# Patient Record
Sex: Female | Born: 1975 | Race: Black or African American | Hispanic: No | Marital: Married | Smoking: Never smoker
Health system: Southern US, Community
[De-identification: ages and names within clinical notes are randomized; demographics above are authoritative.]

## PROBLEM LIST (undated history)

## (undated) HISTORY — PX: ECTOPIC PREGNANCY SURGERY: SHX613

---

## 2006-07-18 ENCOUNTER — Ambulatory Visit (HOSPITAL_COMMUNITY): Admission: RE | Admit: 2006-07-18 | Discharge: 2006-07-18 | Payer: Self-pay | Admitting: Obstetrics and Gynecology

## 2008-11-23 ENCOUNTER — Inpatient Hospital Stay (HOSPITAL_COMMUNITY): Admission: RE | Admit: 2008-11-23 | Discharge: 2008-11-25 | Payer: Self-pay | Admitting: Obstetrics and Gynecology

## 2008-11-24 ENCOUNTER — Encounter: Payer: Self-pay | Admitting: Obstetrics and Gynecology

## 2008-12-20 ENCOUNTER — Inpatient Hospital Stay (HOSPITAL_COMMUNITY): Admission: AD | Admit: 2008-12-20 | Discharge: 2008-12-22 | Payer: Self-pay | Admitting: Obstetrics and Gynecology

## 2010-08-19 ENCOUNTER — Encounter: Payer: Self-pay | Admitting: Obstetrics and Gynecology

## 2010-08-20 ENCOUNTER — Encounter: Payer: Self-pay | Admitting: Obstetrics and Gynecology

## 2010-10-19 IMAGING — US US OB COMP LESS 14 WK
1 series · 7 of 7 positions shown · non-contrast
Comparison: None.

CLINICAL DATA: bleeding; ; TRIPLETS

OBSTETRIC <14 WK US AND TRANSVAGINAL OB US
TECHNIQUE: Both transabdominal and transvaginal ultrasound
examinations were performed for complete evaluation of the
gestation as well as the maternal uterus, adnexal regions, and
pelvic cul-de-sac.

[Series 1: us ob comp less 14 wk · 7 of 7 slices shown]
[im 1/7]
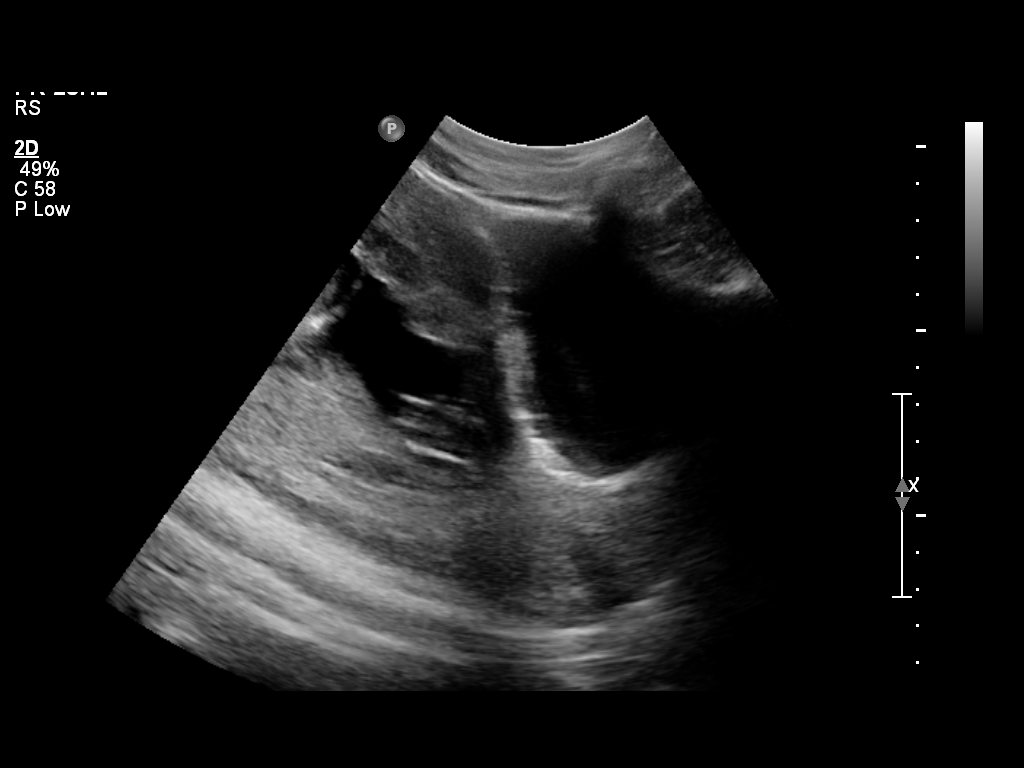
[im 2/7]
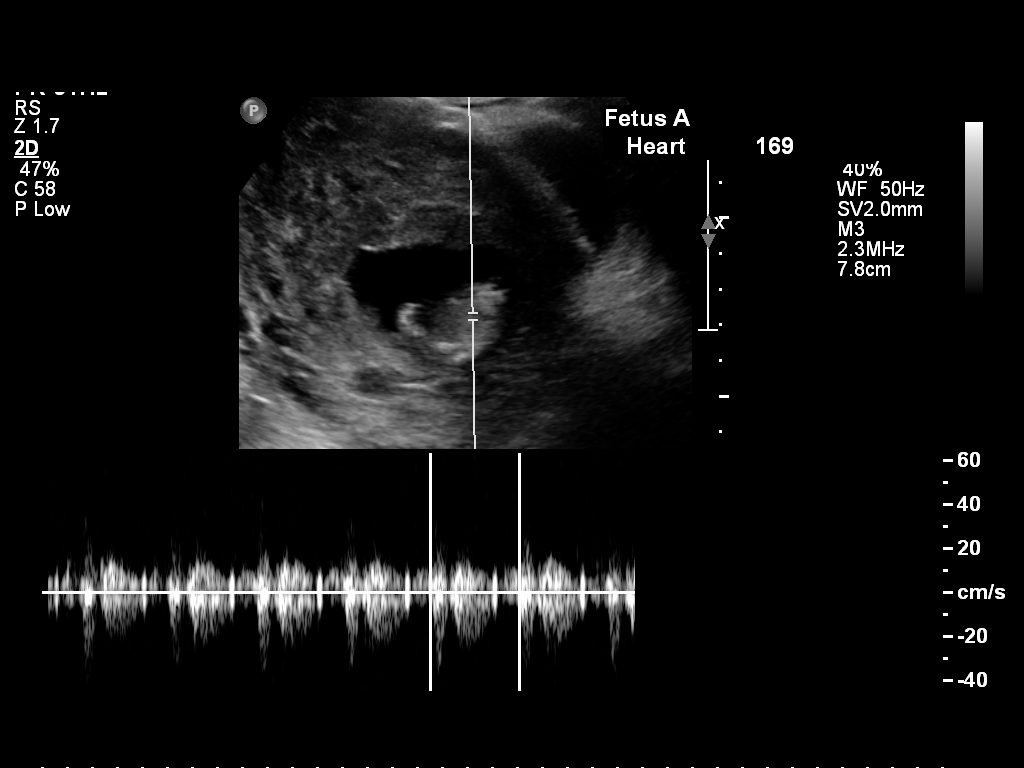
[im 3/7]
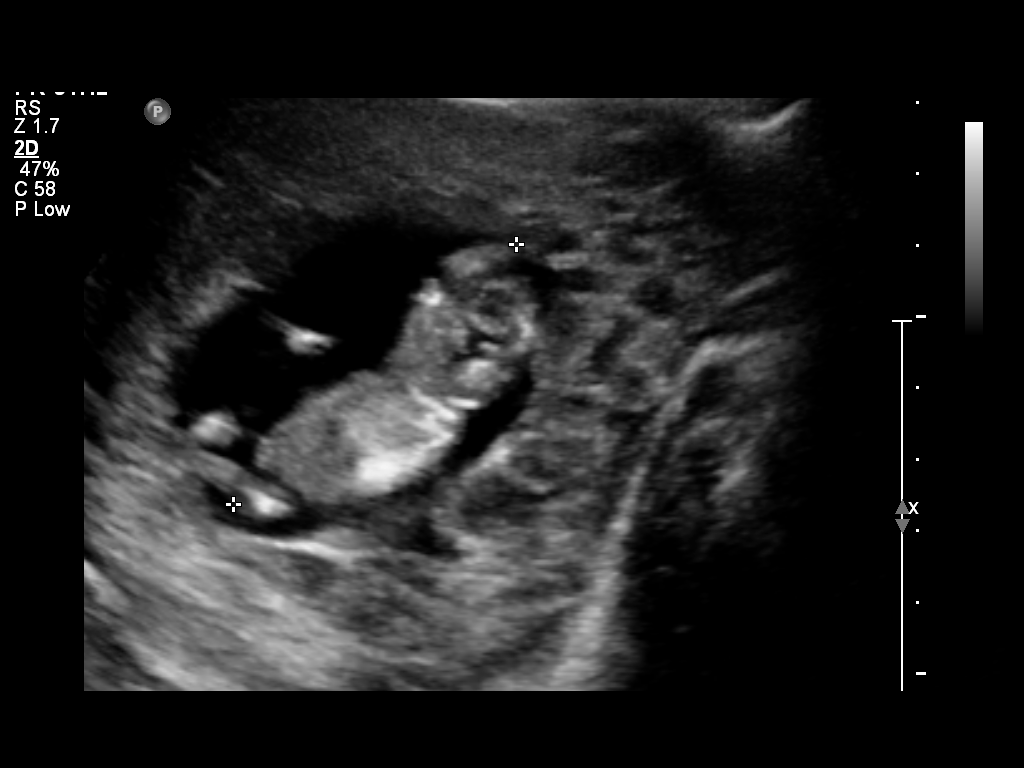
[im 4/7]
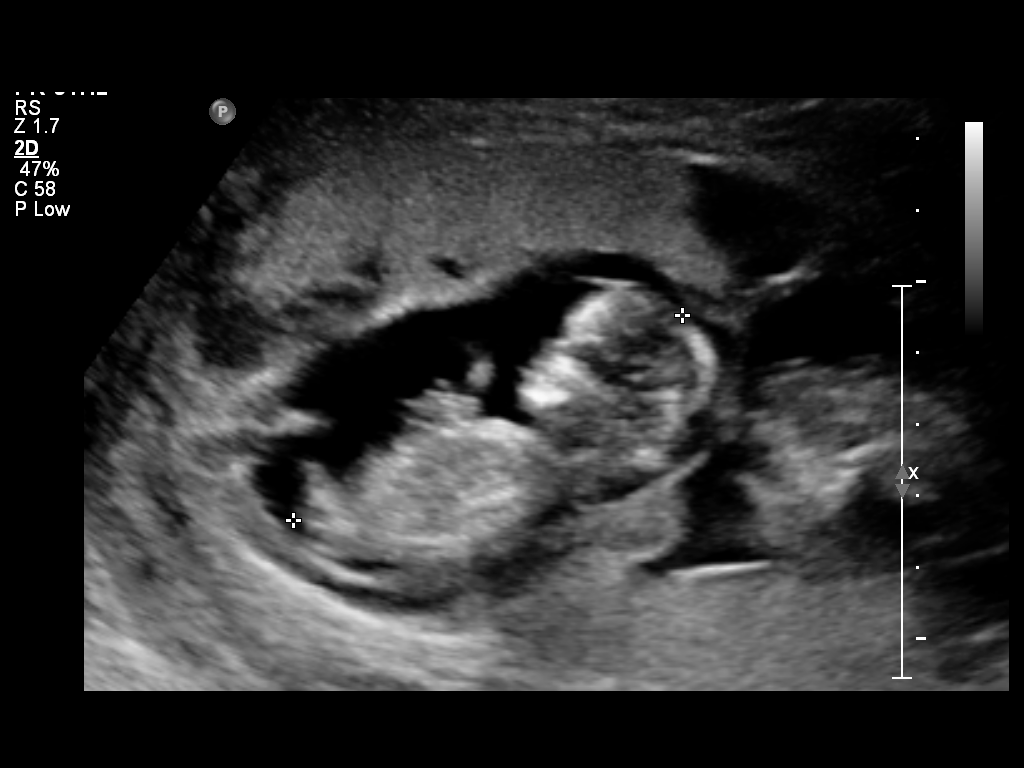
[im 5/7]
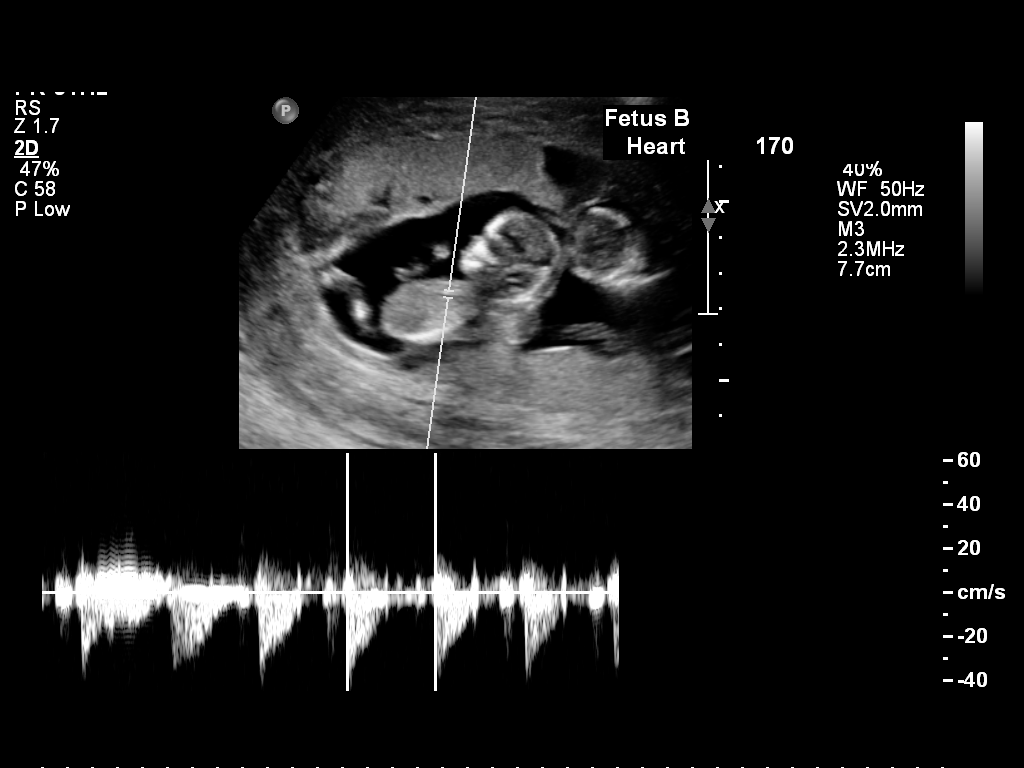
[im 6/7]
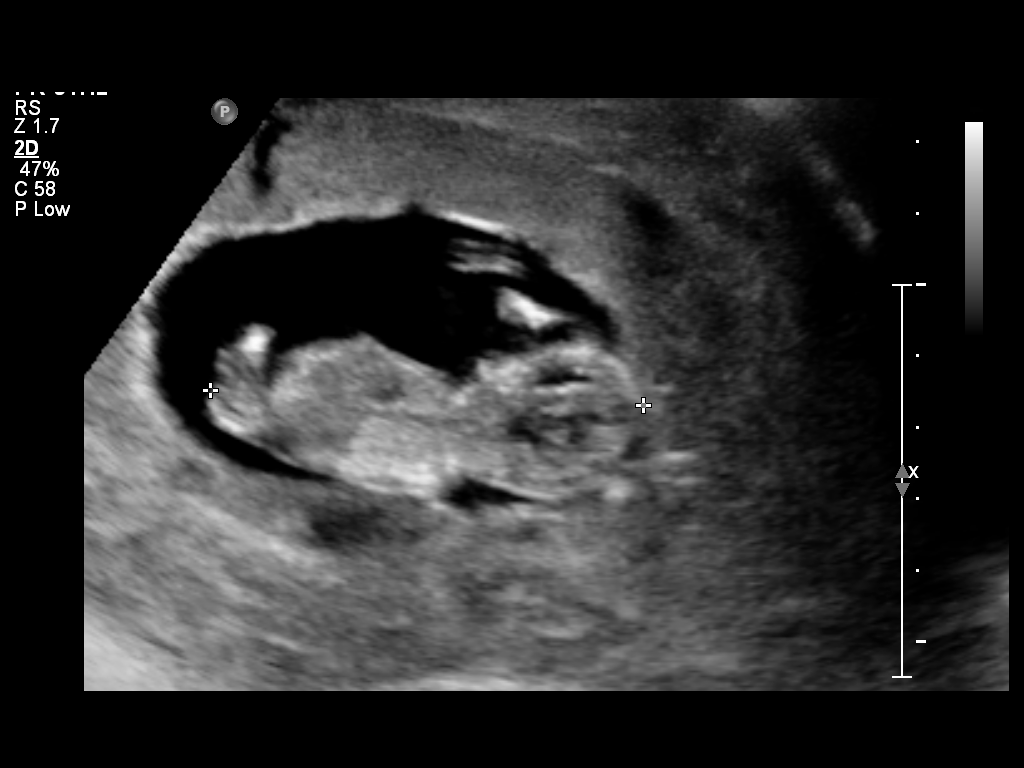
[im 7/7]
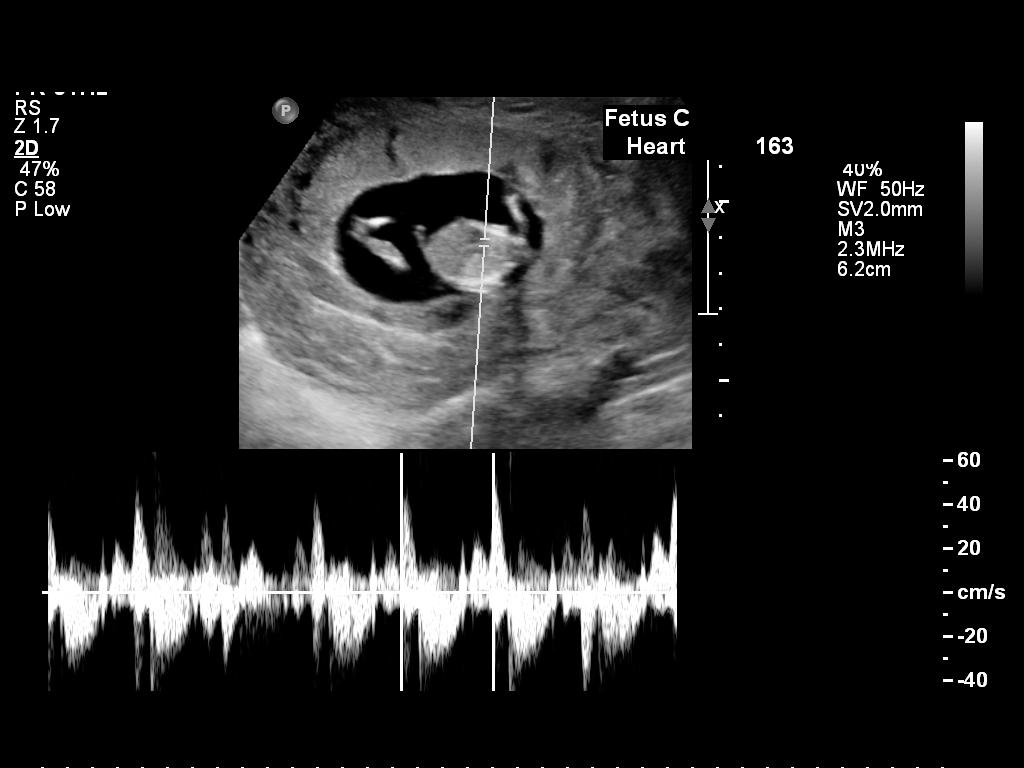

[7 of 7 positions shown; findings below may reference images not displayed]

FINDINGS: There are three intrauterine gestations identified.
Fetus A has a crown-rump length of 53.5 mm per estimated
gestational age 12 weeks 1 day.  Fetal heart rate 169 beats per
minute.  Fetus B has a crown-rump length of 61.2 mm for estimated
gestational age 12 weeks 4 days.  Fetal heart rate 170 beats per
minute.  Fetus C has a crown-rump length of 60.3 mm for estimated
gestational age 12 weeks 4 days.  Fetal heart rate 163 beats per
minute.  No subchorionic hemorrhage.

Ovaries not visualized.  No free fluid visualized.
IMPRESSION: Viable triplets as described above.

## 2010-10-19 IMAGING — CT CT HEAD W/O CM
1 series · 16 of 30 positions shown, 20 images · non-contrast
Comparison: None.

CLINICAL DATA: Left-sided headaches and numbness.

CT HEAD WITHOUT CONTRAST
TECHNIQUE: Contiguous axial images were obtained from the base of
the skull through the vertex without contrast.

[Series 2: brain · axial · 0.47mm/px · z∈[+80,+220]mm · 16 of 32 slices shown, 20 images]
[im 2/32  brain]
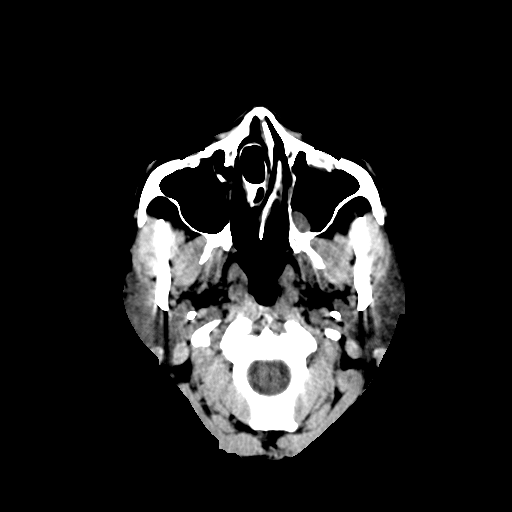
[im 2/32  bone]
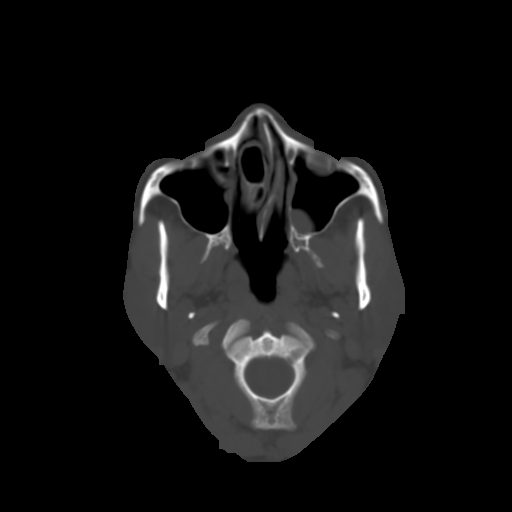
[im 4/32  brain]
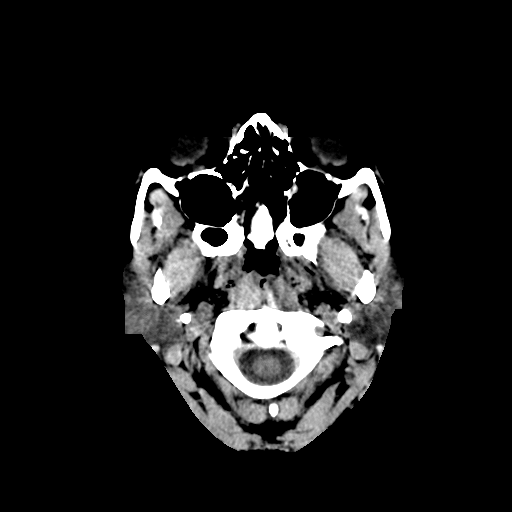
[im 6/32  brain]
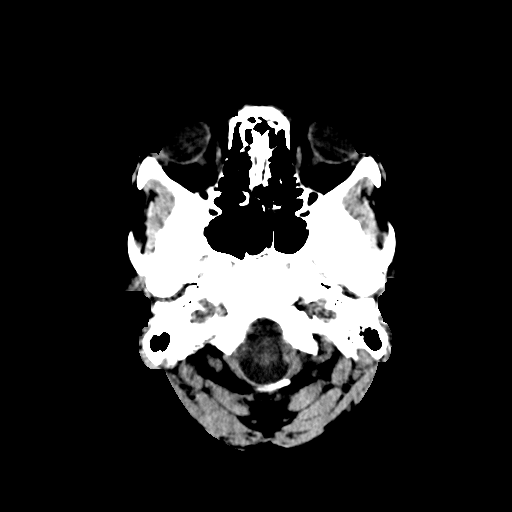
[im 8/32  brain]
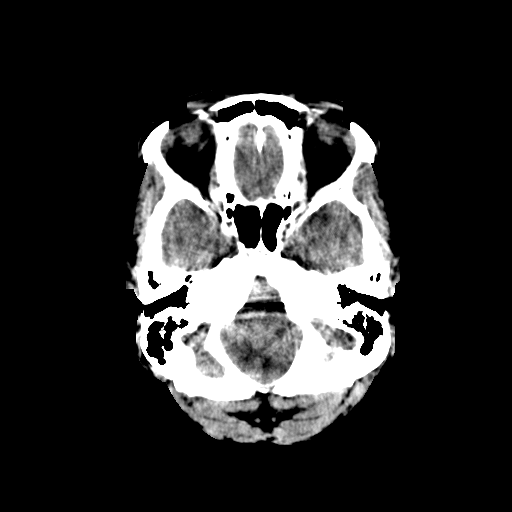
[im 9/32  brain]
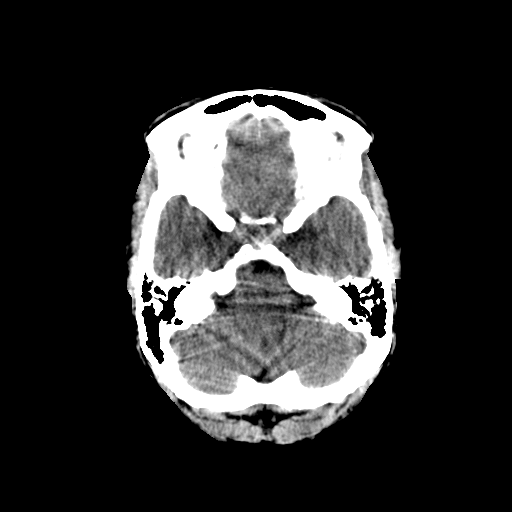
[im 9/32  bone]
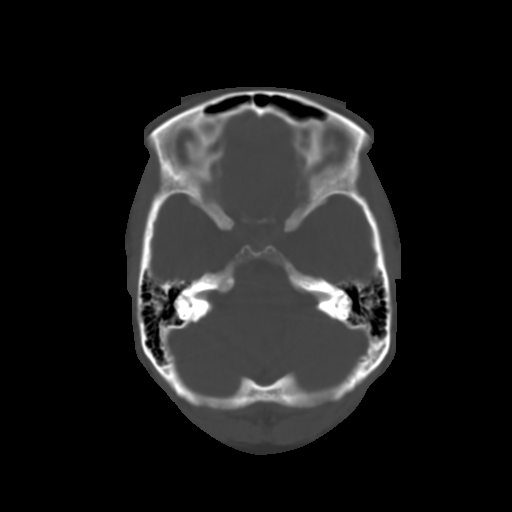
[im 11/32  brain]
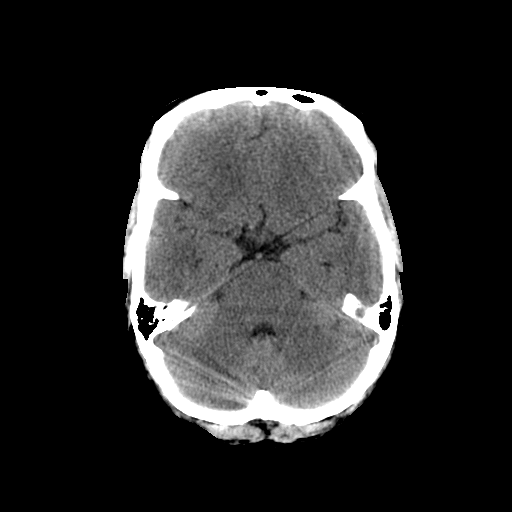
[im 13/32  brain]
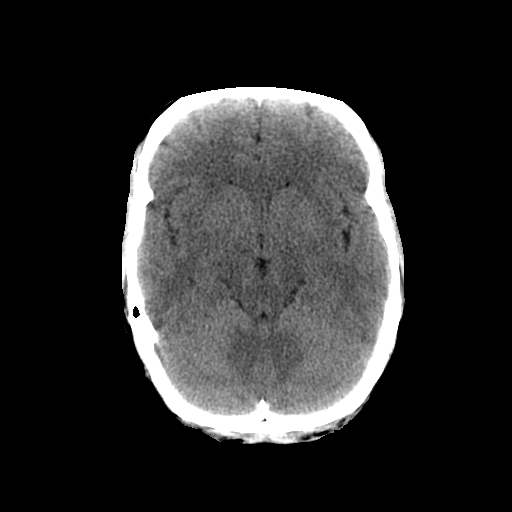
[im 15/32  brain]
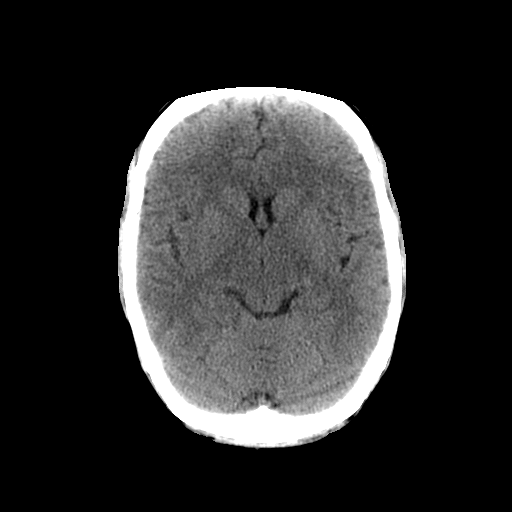
[im 17/32  brain]
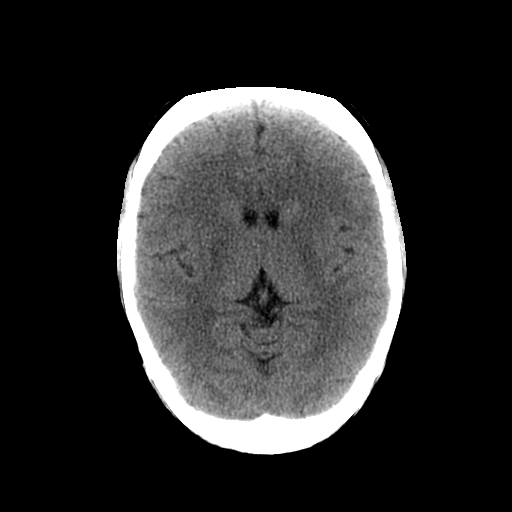
[im 17/32  bone]
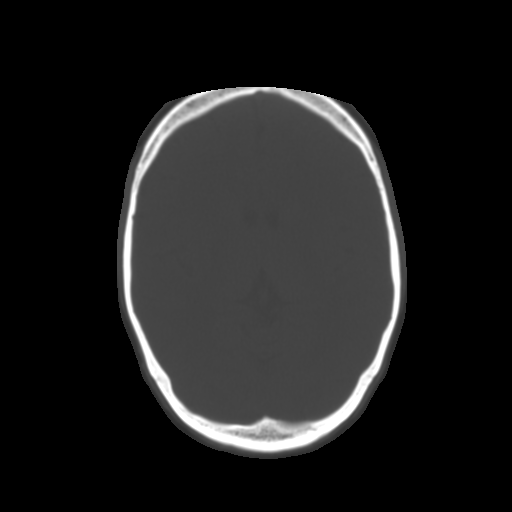
[im 19/32  brain]
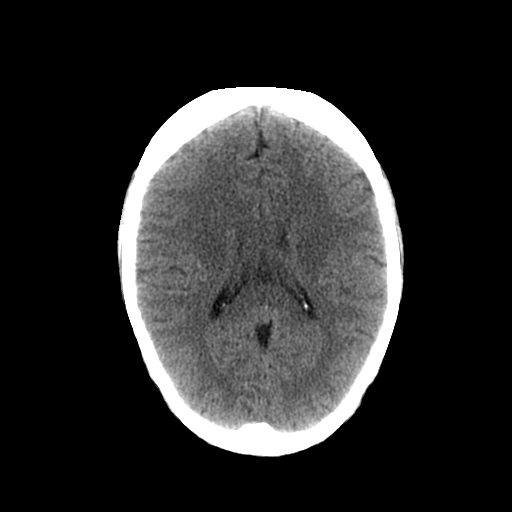
[im 21/32  brain]
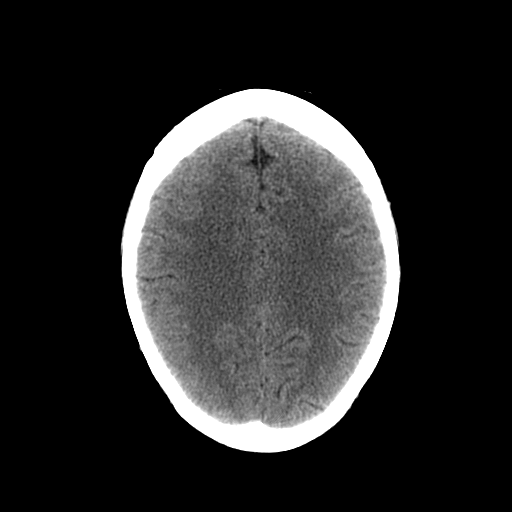
[im 23/32  brain]
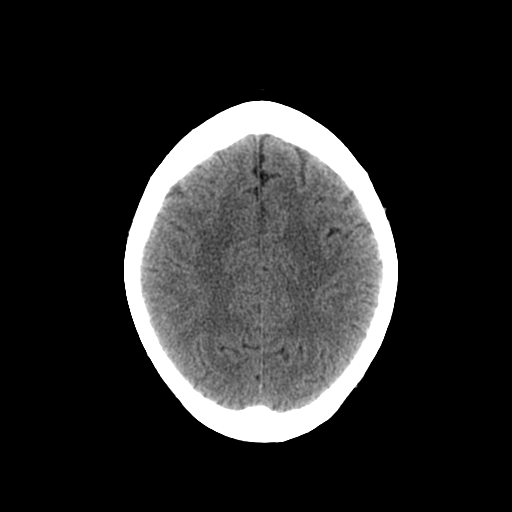
[im 24/32  brain]
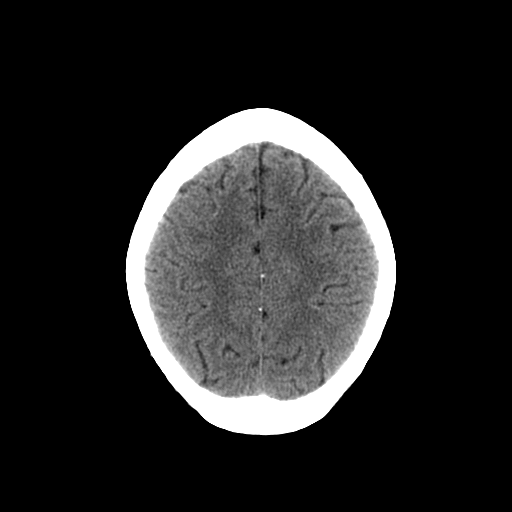
[im 24/32  bone]
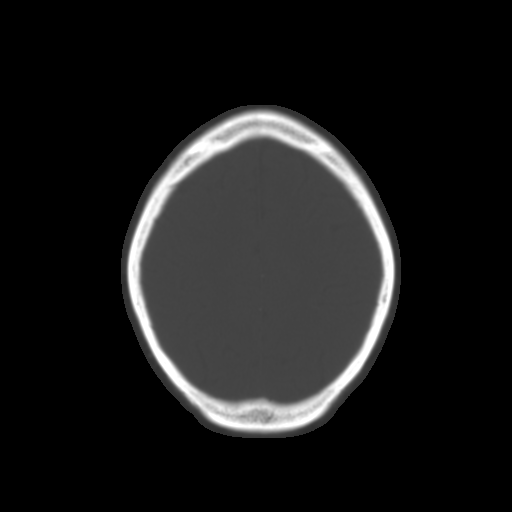
[im 26/32  brain]
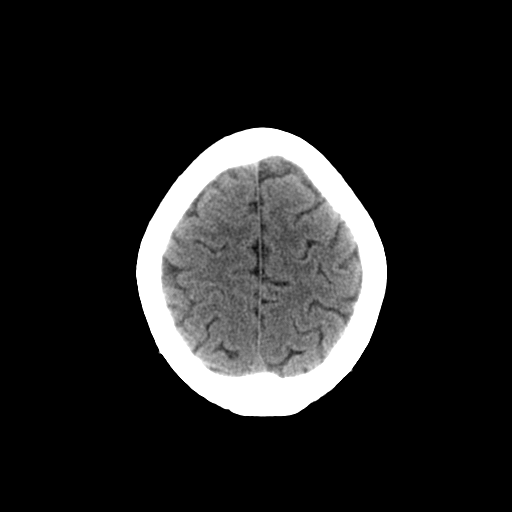
[im 28/32  brain]
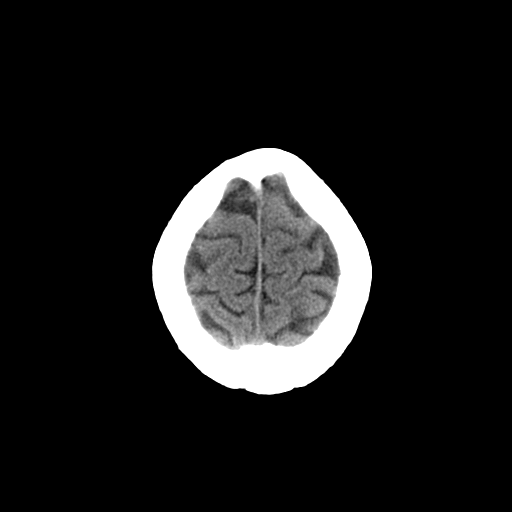
[im 30/32  brain]
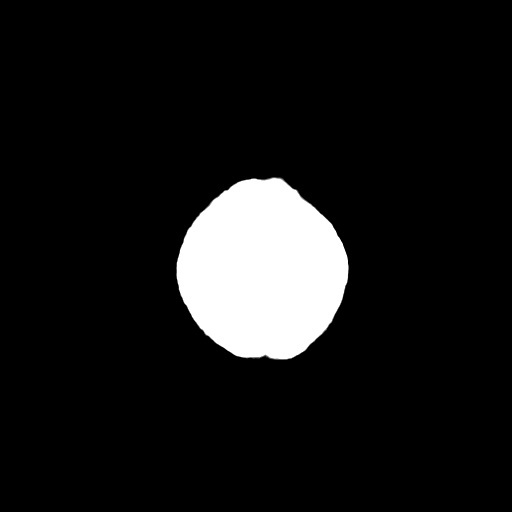

[16 of 30 positions shown; findings below may reference images not displayed]

FINDINGS: There is no evidence for acute hemorrhage, hydrocephalus,
mass lesion, or abnormal extra-axial fluid collection.  No definite
CT evidence for acute infarction.

Polypoid mucosal disease is seen in the left maxillary sinus.
IMPRESSION: No acute intracranial abnormality.

Chronic left maxillary sinus disease.

## 2010-11-06 LAB — DIFFERENTIAL
Basophils Absolute: 0.2 10*3/uL — ABNORMAL HIGH (ref 0.0–0.1)
Eosinophils Absolute: 0.1 10*3/uL (ref 0.0–0.7)
Eosinophils Relative: 1 % (ref 0–5)
Monocytes Absolute: 1 10*3/uL (ref 0.1–1.0)
Monocytes Relative: 9 % (ref 3–12)

## 2010-11-06 LAB — GC/CHLAMYDIA PROBE AMP, GENITAL: GC Probe Amp, Genital: NEGATIVE

## 2010-11-06 LAB — CULTURE, BETA STREP (GROUP B ONLY)

## 2010-11-06 LAB — CBC
HCT: 28.3 % — ABNORMAL LOW (ref 36.0–46.0)
Hemoglobin: 9.9 g/dL — ABNORMAL LOW (ref 12.0–15.0)
Platelets: 279 10*3/uL (ref 150–400)
RBC: 3.01 MIL/uL — ABNORMAL LOW (ref 3.87–5.11)
WBC: 10.6 10*3/uL — ABNORMAL HIGH (ref 4.0–10.5)

## 2010-11-06 LAB — WET PREP, GENITAL
Trich, Wet Prep: NONE SEEN
Yeast Wet Prep HPF POC: NONE SEEN

## 2010-11-07 LAB — URINALYSIS, ROUTINE W REFLEX MICROSCOPIC
Bilirubin Urine: NEGATIVE
Nitrite: NEGATIVE
Protein, ur: NEGATIVE mg/dL
Specific Gravity, Urine: 1.01 (ref 1.005–1.030)
pH: 7 (ref 5.0–8.0)

## 2010-11-07 LAB — COMPREHENSIVE METABOLIC PANEL
ALT: 66 U/L — ABNORMAL HIGH (ref 0–35)
Albumin: 3 g/dL — ABNORMAL LOW (ref 3.5–5.2)
Alkaline Phosphatase: 67 U/L (ref 39–117)
Calcium: 8.8 mg/dL (ref 8.4–10.5)
GFR calc Af Amer: >60 mL/min (ref >60)
Glucose, Bld: 82 mg/dL (ref 70–99)
Potassium: 3.5 mEq/L (ref 3.5–5.1)
Total Bilirubin: 0.5 mg/dL (ref 0.3–1.2)
Total Protein: 5.8 g/dL — ABNORMAL LOW (ref 6.0–8.3)

## 2010-11-07 LAB — CBC
HCT: 29.8 % — ABNORMAL LOW (ref 36.0–46.0)
MCHC: 34.6 g/dL (ref 30.0–36.0)
MCV: 94.9 fL (ref 78.0–100.0)
Platelets: 274 10*3/uL (ref 150–400)

## 2010-11-07 LAB — CULTURE, BLOOD (ROUTINE X 2): Culture: NO GROWTH

## 2010-11-07 LAB — URINE CULTURE

## 2010-11-07 LAB — URINE MICROSCOPIC-ADD ON

## 2010-12-11 NOTE — H&P (Signed)
NAME:  Debbie Wolfe, Debbie Wolfe NO.:  [id number]   MEDICAL RECORD NO.:  [id number]          PATIENT TYPE:  MAT   LOCATION:  MATC                          [facility]ACILITY:  [facility]   PHYSICIAN:  Osborn Coho, M.D.   DATE OF BIRTH:  1975/12/20   DATE OF ADMISSION:  12/20/2008  DATE OF DISCHARGE:                              HISTORY & PHYSICAL   HISTORY OF PRESENT ILLNESS:  Debbie Wolfe is a 35 year old married black  female gravida 4, para 0-0-3-0 at [redacted] weeks gestation per an [facility] of  June 06, 2009 who presents with chief complaint of a gush of fluid  around 7:30 p.m. this evening while she was in the shower and continued  clear leakage of fluid since.  Following that episode in the shower she  walked into the living room and gushing continued while she called to  report the incident and reported that it saturated the towel between her  legs.  Denied any cramping or tightening.  No vaginal bleeding.  No  fever, chills.  Denies respiratory or GI complaints or UTI signs or  symptoms.  She has had a bowel movement today.  She only reported some  recent insomnia related to cannot get comfortable at night.  She does  have some intermittent nausea followed by the MD service at [facility].  History has been remarkable for:  1. Triplet gestation status post IVF (set of mono/di twins).  2. Incompetent cervix, status post cerclage November 24, 2008 (at      approximately [redacted] weeks gestation).  3. History of ectopic pregnancy 2007 with subsequent left      salpingostomy.  4. Penicillin allergy.  5. History of migraines.  6. Left maxillary chronic sinusitis.  7. Asymptomatic bacteriuria with MRSA diagnosed November 25, 2008.  She      was treated with 10-day antibiotic course and urine culture Dec 16, 2008 was negative (blood cultures x2 were negative November 25, 2008).  8. Hospitalized April 26 to April 27 at [facility] with chief complaint of right upper quadrant pain  that was      worse with deep inspiration as well as some chest tightness.  Had      normal GI as well as cardiac workup as well as rule out DVT and      pulmonary embolus.  Had some questionable asthma and sinusitis.      Then was hospitalized April 28 to April 30 at [facility] around the time of her cerclage with chief complaint of      worsening migraine and sinusitis.  9. First trimester spotting.   OBSTETRICAL HISTORY:  Gravida 1; 16 weeks SAB April 1996 and did have  some bleeding and was delivered at [facility].  Gravida 2 was a 16 week  SAB.  Cervix started opening around that time.  Was diagnosed with  incompetent cervix and that was in April 1997.  Gravida 3 was an ectopic  pregnancy in 2007.  Did have a  left salpingectomy by Dr. Allena Katz.  Gravida  4 is current pregnancy and is status post IVF and is a triplet  gestation.   MENSTRUAL HISTORY:  Menarche 35 years of age.  Did have monthly cycles.  No abnormalities.  She had IVF September 13, 2008 and best [facility] by  ultrasound was June 06, 2009.   ALLERGIES:  PENICILLIN causes dizziness.   PAST MEDICAL HISTORY:  Incompetent cervix with second pregnancy.  For  contraception in the past she used Depo-Provera as well as birth control  pills.  History of abnormal Pap smear 1996, had a normal colporrhaphy  and last Pap smear was in February 2010 at [facility] and was normal.  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  Reports varicella as a child and normal childhood illnesses.  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  History of migraines.  Has been hospitalized before  with miscarriages.   SURGICAL HISTORY:  Remarkable for ectopic pregnancy in 2007 as well as  wisdom teeth x1.   FAMILY HISTORY:  She does report that she is unfamiliar with paternal  history.  Maternal aunt had tuberculosis.  Maternal aunt and uncle as  was  well as cousin with migraines.  Mom lupus and she is deceased.  Mom  had bone and lung cancer.  Maternal aunt and uncle with lung cancer.  Maternal aunts and uncles nicotine addiction.   GENETIC HISTORY:  Remarkable for the patient's brother with mental  retardation.  The patient's maternal grandfather with an identical twin.   SOCIAL HISTORY:  The patient is a married black female.  She is of  [place] faith.  Her husband's name is Promise Weldin and he is involved  and supportive.  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  He has had  high school education.  The patient denied alcohol, tobacco or illicit  drug use.   HISTORY OF PRESENT PREGNANCY:  She entered care April 7 at new OB  interview at [facility] was around [redacted] weeks gestation.  On April 8 she returned  for new OB workup.  She did desire first trimester screen.  Had  ultrasound that day for viability and triplets were noted.  Size was  equal to dates.  Baby A had a heart rate 152, B 164, C 154, normal  fluids.  Twin peak sign noted for the twins, yolk sac was seen x3 and  ovaries were within normal limits.  The patient was on Prometrium as  well as progesterone suppositories daily.  The patient returned April 15  at 10 weeks for her first visit with doctors and was with Dr. [name].  In Dr. Lilian Coma note she had had no previous history of PPROM or fever  with the previous two 16-week losses.  Was prescribed some Zofran that  day for nausea.  On that day Dr. [name] did recommend cerclage at 12  weeks.  She also recommended 17P starting at 16 weeks to 36 weeks as  well as early bedrest, DVT prophylaxis with Lovenox and C-section  delivery.  On April 28 is when she had called and started having a  worsening headache and presented to [facility], had a subsequent CT  scan of her head was within normal limits with the exception of the  chronic sinusitis on  the left maxillary and  did remain hospitalized  April 28 through April 30 at that [facility].  During  which time she did have her cerclage placed on April 29.  She did have  one other appointment, her last appointment is available on her prenatal  record at 14-3/7 weeks had an ultrasound cervical length was 3.19 cm,  triplet IUP, three heart rates, cervix was closed and long.  She was on  modified bed rest and a follow-up in 1 week for cervical length as well  as urine test of cure and plan was made to again start the 17P at 17  weeks.   OBJECTIVE:  VITAL SIGNS:  On admission today her vital signs 117/70,  heart rate 105, temperature 96, respirations 20.  GENERAL:  No acute distress, alert, oriented x3 and pleasant.  HEENT:  Within normal limits.  CARDIOVASCULAR:  Regular rate and rhythm without murmur.  LUNGS:  Clear to auscultation bilaterally.  ABDOMEN:  Soft, nontender and gravid.  Fundal height greater than dates  secondary to triplet gestation.  PELVIC:  Sterile spec exam positive clear fluid pooling in vagina,  positive fern.  Did visualize string of her cerclage was palpated.  Bimanual exam cervix was closed, approximately 50% and high.  Cultures  were obtained GC chlamydia as well as GBS are still pending.  Her wet  prep was within normal limits.  It was negative for yeast, no clue cells  and no Trichomonas.  Extremities were within normal limits.  No edema.  She did have a formal ultrasound, baby A had a heart rate of 170 and  breech, posterior placenta above os, oligohydramnios, large pocket  measured 0 cm of fluid.  Estimated fetal weight 5 ounces (63rd  percentile, 14th percentile discordance, adjusted gestational age per  ultrasound was 15 weeks and 5 days).  It is presumptive boy.  Baby B  heart rate 156, breech is the left fetus, anterior placenta above os,  marginal cord insertion, AFI within normal limits with largest pocket  5.9 cm.  Estimated fetal  weight 5 ounces greater than 75th percentile,  gestational age adjusted per ultrasound today 16-3/7 weeks, boy.  Baby C  fetal heart rate 150, right fetus, cephalic presentation, anterior  placenta above os.  AFI within normal limits with largest pocket  measuring 4.6 cm.  Estimated fetal weight 5 ounces (71st percentile).  Adjusted gestational age per ultrasound today 16-1/7 weeks, presumptive  boy.  All of the did have some limited anatomy done, difficulties of  course based because triplets and then because of the oligohydramnios.  Cervical length translabially was 1.5 cm and cerclage was visualized.  Both ovaries not well visualized.   IMPRESSION:  1. Triplet gestation at 16 weeks with premature preterm rupture of      membranes of fetus A.  2. History of incompetent cervix with cerclage placed November 24, 2008.  3. History of methicillin-resistant Staphylococcus aureus in urine      April 30 and is status post  Treatment.   PLAN:  1. Will admit to antenatal unit with Dr. Osborn Coho as attending      physician.  2. She is going to begin IV erythromycin for 48 hours every 6 hours      and then will complete 5-day course p.o.  She is going to be on      strict bedrest currently with venous thromboembolism prophylaxis.  3. MD is to follow and will observe closely.      Candice  Denny Levy, CNM      Osborn Coho, M.D.  Electronically Signed    CHS/MEDQ  D:  12/20/2008  T:  12/21/2008  Job:  [id number]

## 2010-12-11 NOTE — Op Note (Signed)
Debbie Wolfe, Debbie Wolfe               ACCOUNT NO.:  [id number]   MEDICAL RECORD NO.:  [id number]          PATIENT TYPE:  AMB   LOCATION:  SDC                           [facility]  [facility]   PHYSICIAN:  Hal Morales, M.D.DATE OF BIRTH:  10/06/1975   DATE OF PROCEDURE:  11/24/2008  DATE OF DISCHARGE:                               OPERATIVE REPORT   PREOPERATIVE DIAGNOSES:  Triplet intrauterine pregnancy at 13 weeks'  gestation, incompetent cervix, severe headache, first trimester  bleeding, and history of 2 second trimester losses.   POSTOPERATIVE DIAGNOSES:  Triplet intrauterine pregnancy at 48 weeks'  gestation, incompetent cervix, severe headache, first trimester  bleeding, and history of 2 second trimester losses.   PROCEDURE:  Cervical cerclage.   SURGEON:  Vanessa P. Haygood, MD   ANESTHESIA:  Spinal.   ESTIMATED BLOOD LOSS:  Less than 10 mL.   COMPLICATIONS:  None.   FINDINGS:  The cervix was long and closed with a firm substance.   PROCEDURE IN DETAIL:  The patient was taken to the operating room after  appropriate identification and placed on the operating table.  After the  placement of a spinal anesthetic, she was placed in the lithotomy  position.  The perineum and vagina were prepped with multiple layers of  Betadine and the bladder emptied with a red Robinson catheter.  The  perineum was draped as a sterile field.  A weighted speculum was placed  in the posterior vagina and a Beaver used to elevate the bladder.  The  cervicovaginal junction was identified and the level of the internal os  estimated.  A suture of 5-mm Mersilene was then used to place a  pursestring suture at the level of the internal os.  It was tied  posteriorly.  Hemostasis was noted to be adequate.  All instruments were  then removed from the vagina and the cervical cerclage noted to be in  place with approximately 3 mm of distance between the endocervical  surfaces as the suture had been tied  down.  The patient tolerated the  procedure well, was taken to the recovery room in satisfactory condition  for [facility] after her post anesthesia care.      Hal Morales, M.D.  Electronically Signed     VPH/MEDQ  D:  11/24/2008  T:  11/25/2008  Job:  [id number]

## 2010-12-11 NOTE — Discharge Summary (Signed)
Debbie Wolfe, Debbie Wolfe NO.:  [id number]   MEDICAL RECORD NO.:  [id number]          PATIENT TYPE:  INP   LOCATION:  9320                          [facility]ILITY:  [facility]   PHYSICIAN:  [name], M.D.DATE OF BIRTH:  [birth year]   DATE OF ADMISSION:  11/23/2008  DATE OF DISCHARGE:  11/25/2008                               DISCHARGE SUMMARY   PROCEDURES:  1. CT scan of the head without contrast.  2. Cervical cerclage on November 24, 2008.  3. Spinal anesthesia on November 24, 2008, for cerclage placement.  4. Urine culture which resulted in a positive culture for MRSA.  5. Negative blood cultures x2.   CONSULTS:  She did have a maternal fetal medicine consult on November 24, 2008, by Dr. [name].   ADMITTING DIAGNOSES:  1. Triplet pregnancy intrauterine at 42 weeks' gestation.  2. Incompetent cervix.  3. Migraine  4. Asthma.  5. Anemia.  6. Questionable anxiety.  7. Probable sinusitis.   DISCHARGE DIAGNOSES:  1. Triplet pregnancy intrauterine at 51 weeks' gestation.  2. Incompetent cervix.  3. Migraine  4. Asthma.  5. Anemia.  6. Questionable anxiety.  7. Probable sinusitis.  8. Urine culture positive for methicillin resistant Staphylococcus      aureus.  9. Discharge gestation 12 weeks and 3 days.  10.Constipation.  11.Elevated liver function tests, but decreasing since      hospitalization, probably related to nausea and vomiting.   HOSPITAL COURSE:  This female is a 35 year old gravida 4, para 0-0-3-0,  who presented on the day of admission at 57 weeks' gestation with  triplets via IVF, who had, had onset of migraine since the previous day  around 5:00 p.m.  She had been previously admitted and discharged from  [facility], November 21, 2008 through November 22, 2008, it looked like.  She presented via EMS with a chief complaint of  right upper quadrant pain which worsened with deep inspiration, as well  as some shortness of  breath, chest tightness and some chest pain.  Denied any coughing, did have some nasal congestion.  She did also  complain of some on admission to [facility] some dizziness, as well  as some lower abdominal cramping and some spotting.  The headache had  continued to worsen, did note with wiping some pink tinged fluid.  At  [facility], she did have an array of workups  ruling out DVT, as well as pulmonary embolus.  She did have some  abdominal ultrasounds to rule out gallstones.  Everything was negative.  She had a normal chest x-ray, normal EKG, not elevated D-dimer.  She did  have lower extremity Dopplers to rule out DVT and was admitted and  treated for the headache, as well as for probable asthma and sinusitis.  When she presented to [facility], had first been tried on some  Midrin, as well as some O2 nasal cannula, as well as Ocean Mist nasal  spray and Zyrtec with little relief and was subsequently admitted to the  third floor for some  morphine therapy IV and as well to start on a Z-  Pak.  She was scheduled for the cerclage to be placed on November 24, 2008,  with Dr. [name].  On the night of November 23, 2008, she did have a  viability ultrasound.  Fetus A had a heart rate of 169, B 170 and C was  163.  Cervical length was 74 cm.  There were no areas of subchorionic  hemorrhage.  She also had some blood work on her date of admission,  November 23, 2008.  She had, had a normal white blood cell count at [facility] prior to her admission to [facility], however, it was  slightly elevated on her check at 13.6.  Hemoglobin was down to 10.3.  Her platelets were normal at 274.  Electrolytes were within normal  limits.  She had a complete metabolic panel.  AST was 43 and ALT was 66.  Both of those the highest that I could find that they were at [facility] was AST 50, ALT was 54.  Urine was sent for culture.  On  initial urinalysis, she had trace  hemoglobin and small leukocyte  esterase.  She did have a CT of head at [facility] with polypoid  mucosal disease seen in left maxillary sinus and impression was no acute  intracranial abnormality, chronic left maxillary sinus disease.  Dr.  Normand Sloop did check on the patient just before midnight on the November 23, 2008.  She complained of some increasing cramping and bleeding.  On  sterile spec exam, the patient had a quarter-sized clot at the external  os, but no active vaginal bleeding and cervix appeared dilated about 0.5  cm.  She did receive a 2 gram magnesium sulfate bolus both for headache  and to also maybe help with the cramping and was started on some Motrin.  She was placed on a pad count and as I mentioned, she did have a  subsequent ultrasound for viability and pregnancy did not have any other  complications and plan was to proceed with strict bedrest and if  cramping resolved would continue with cerclage placement the following  day.  On November 24, 2008, Dr. [name] assessed the patient around 9:45  a.m., before proceeding with cerclage.  The patient was without  complaints.  Her headache had improved with her Tylenol.  She was  afebrile.  Her vital signs were stable, had, had no further bleeding  overnight.  After being seen by Dr. Normand Sloop the previous late night  visit, the patient was taken to the OR where the procedure was performed  under spinal anesthesia on November 24, 2008, by Dr. [name] without  complications.  On November 24, 2008, she also did have maternal fetal  medicine consult performed by Dr. Herbert Seta L. Rachel Bo.  As they progressed  on November 24, 2008, the patient did still have some nausea and vomiting,  as well as continued headache.  On November 25, 2008, the patient had  complained of awaking with worsening headache around 4:00 a.m.  She did  receive two Vicodin tablets at 4:10, with little relief by 6:30.  At  that point, she was given 2.5 mg of morphine.  Around  6:52 a.m., she did  report that the medicine did take the edge off, but did not resolve  completely.  She reports that her nausea was worse with her headache.  The headache pain she described as frontal, as well as  temporal and  along her cheek bones.  She did have an ice back on her head.  In  regards to the nausea, she did try to eat [place] toast and some bacon  for breakfast and subsequently had emesis episodes x2.  The patient  initially did not feel that she was ready to go home in light of not  being able to keep down any food or fluid and continued headache.  The  patient did have some complaints of urge to defecation but with  constipation.  She had some light pink intermittent discharge only with  wiping since cerclage placement.  She had no lower extremity pain.  No  shortness of breath or cough, some slight dizziness with ambulation, but  that had not changed since she had been hospitalized at [facility].  Her  vision was within normal limits.  She had been started on a Z-Pak the  previous day.  She was afebrile.  Her vital signs were stable.  She had,  had good urinary output.  Physical exam was overall within normal  limits.  She did have some grimace and malaise, did have maxillary sinus  tenderness.  Abdomen was soft, it was slightly tender in all four  quadrants, but bowel sounds were present x4.  No rebound or guarding.  She did have her TED hose on for VTE prophylaxis while on bedrest.  After a discussion with Dr. Stefano Gaul, the patient was okayed for  discharge home and was discharged home in stable condition to schedule  an outpatient appointment as soon as possible with a neurologist  regarding headaches, as well as continuing to eval treatment for the  chronic sinusitis.  Discharge follow-up was to occur in 2 weeks with Dr.  [name] in the office.   DISCHARGE INSTRUCTIONS:  She was to continue modified bedrest and to  wear her TED hose throughout the day.  Did discuss  premedicating before  meals if possible with some Phenergan if she was nauseated.   DISCHARGE MEDICATIONS:  1. Prenatal vitamin 1 tablet p.o. daily.  2. Phenergan 25 mg 1/2 every 4-6 hours p.r.n. nausea, vomiting and to      use to premedicate before trying to attempt a meal.  3. Midrin for headache as directed.  4. Tylenol #3 as needed for headache 1-2 tablets every 4-6 hours.  5. She was to continue a Z-Pak for the next 4 days.  6. Clindamycin 150 mg every 6 hours for 10 days.  She did have a      positive urine culture with MRSA.  7.  Colace 1 tablet every 6      hours to prevent constipation and she was instructed on use of      MiraLax p.r.n. if she did go more than 3 days without a bowel      movement and was to use it daily until bowel movement.      Candice Denny Levy, [place]      [name], M.D.  Electronically Signed    CHS/MEDQ  D:  12/20/2008  T:  12/20/2008  Job:  [id number]

## 2010-12-11 NOTE — H&P (Signed)
NAMEJENA, TEGELER               ACCOUNT NO.:  [id number]   MEDICAL RECORD NO.:  [id number]          PATIENT TYPE:  OBV   LOCATION:  9320                          [facility]ILITY:  [facility]   PHYSICIAN:  Naima A. Dillard, M.D. DATE OF BIRTH:  1976-03-11   DATE OF ADMISSION:  11/24/2008  DATE OF DISCHARGE:                              HISTORY & PHYSICAL   Ms. Marich is a 73 gravida 4, para 0-0-3-0 at [redacted] weeks gestation with  triplets via IVF who presents a migraine headache since last night  around 5:00 p.m.  Her pregnancy is remarkable for:  1. Triplets with one set of mono/Di twins.  2. Short stature  3. Incompetent cervix with a 16-week loss x2 pregnancies.  4. History of ectopic with salpingectomy in 2007.  5. History of migraines.  6. Penicillin allergy.  7. Infertility with IVF.  8. She is on 17P.  9. Cerclage scheduled for tomorrow.   PRENATAL LABS:  Ms. Gasser prenatal labs include an initial  hemoglobin of 12.4, platelet 344,000, blood type O+, antibody screen  negative, sickle cell trait negative, RPR nonreactive, rubella titer  immune, hepatitis B negative, hepatitis C negative, HIV nonreactive.   CURRENT MEDICATIONS:  Include prenatal vitamin, Tylenol 2 and Phenergan  25 mg.   HISTORY OF PRESENT PREGNANCY:  Ms. Quilling presented for prenatal care  following in vitro  fertilization by Dr. Elesa Hacker at 9 weeks.  She was on  Prometrium and progesterone at that time and  we were already planning a  12 week cerclage due to her history of incompetent cervix with a  singleton gestation.  At 10 weeks we saw her again and she saw Dr.  Estanislado Pandy due to her high risk status. Two days ago she went by ambulance  to [facility] for shortness of breath and abdominal  pain.  She had quite a workup there including negative EKG, negative  chest x-ray, a normal abdominal ultrasound with no gallstones and normal  spleen, negative DVT studies of the bilateral lower extremities  with  ultrasound.  Labs include a negative D-dimer and elevated AST at 58, ALT  54.  She was discharged from the hospital without ever figuring out the  etiology of her right abdominal pain although appendicitis was ruled  out.  She had been given nebulizer treatments and steroids for some  wheezing although she had no history of asthma prior to this and she  left directly from [facility] in 01/02 [place] [facility] office for her first trimester screen and visit with Dr.  [name].  Following that she went home and got a migraine headaches.  She began taking one Tylenol with codeine and one regular Tylenol at  5:00 p.m. which she proceeded to take every 4 hours until this morning  without good relief.  She also took one Phenergan tablet this morning  and 10:50.  Her migraine involves photosensitivity and blurry vision.  She is nauseous but did not vomit.  When she got here, she had some pink  tinged tissue with voiding one time and she  began to have some chest  pain but then it resolved.  She felt it might have been due to her  anxiety.  Her OB history includes pregnancy #1 which ended April of  1996, a  spontaneous loss at 16 weeks.  Pregnancy #29 October 1995, a  spontaneous loss at 16 weeks.  Pregnancy #3 in 2007, ectopic with  salpingectomy. Pregnancy #4 IVF triplets, current pregnancy.   ALLERGIES:  Ms. Mullinax is allergic to penicillin which causes  dizziness.   MEDICAL HISTORY:  She has a history of  incompetent cervix.  She has a  history of using Depo-Provera and oral contraception.  She has a history  of infertility.  She had the usual childhood illnesses including  chickenpox.  She has a history of bladder infections though not often.  She has a history of migraines and ectopic pregnancy.   SURGICAL HISTORY:  In 2007 ectopic pregnancy, salpingectomy and wisdom  teeth.   FAMILY HISTORY:  Her maternal aunt had tuberculosis.  Her maternal aunt,  uncle and  cousin have migraines.  Her mother had lupus.  She is  deceased.  Her mother had bone cancer.  Maternal aunt and uncle had lung  cancer.  Maternal aunt and uncle are smokers.  The patient's brother is  mentally retarded.   GENETIC HISTORY:  Her sickle cell screen is negative.  Her first  trimester screen results are pending.  There is a strong history of  twins in the family.  Maternal grandfather was an identical twin.  The  father of the baby has aunts who are identical twins.   SOCIAL HISTORY:  She is married.  An African American with  some college  education who works as a Conservator, museum/gallery.  Her husband has a  high school education. He works as an Tree surgeon.  His name is Hiedi Touchton.  Stated religion is [place].  The patient denies any use of  alcohol, tobacco or street drugs during this pregnancy.   PHYSICAL EXAMINATION:  Exam was normal.  The patient was guarding her  eyes from the light which I had dimmed the overhead light and was using  remote lighting.  LUNGS:  Clear to auscultation.  HEART:  Regular rate and rhythm.  No murmur.  Her  pulse was a little  elevated when she got here in the 90s.  We did start some IV fluid and  it came down.  VITAL SIGNS:  Stable.  ABDOMEN:  Soft.  The uterus was soft, gravid and large for gestational  age.  The area of abdominal tenderness is near the umbilicus to the  right.  There was no rebound tenderness in that area.  EXTREMITIES:  Without edema and she had normal DTRs and negative Homans.  She was assessed in the  Maternity Admissions Unit with oxygen and given  Midrin and IV fluids that did not help her headache so she was given  morphine and sent for a CT scan.  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   She was then admitted to the [facility] for management of her migraine  and observation overnight in preparation for her  cerclage tomorrow  morning.  Multiple medications to manage her migraine including pain  medicines, sleeping medication, allergy medicine, a Z-Pak, albuterol  inhaler, saline spray and IV fluids.  The patient was seen by Dr.  Normand Sloop and  she is n.p.o.  after midnight for her cerclage in the  morning.      Janna Melsness, CNM      Naima A. Normand Sloop, M.D.  Electronically Signed    JM/MEDQ  D:  11/23/2008  T:  11/23/2008  Job:  [id number]

## 2010-12-14 NOTE — Discharge Summary (Signed)
Debbie Wolfe, Debbie Wolfe NO.:  [id number]   MEDICAL RECORD NO.:  [id number]          PATIENT TYPE:  INP   LOCATION:  9161                          [facility]:  [facility]   PHYSICIAN:  [name], M.D.DATE OF BIRTH:  [birth year]   DATE OF ADMISSION:  12/20/2008  DATE OF DISCHARGE:  12/22/2008                               DISCHARGE SUMMARY   ADMITTING DIAGNOSES:  1. Triplet gestation at 16 weeks with premature rupture of membranes      fetus A.  2. History of incompetent cervix and status post a cerclage, November 24, 2008.  3. History of methicillin-resistant Staphylococcus aureus diagnosed      November 25, 2008 and status post antibiotic treatment.  4. Anemia.  5. History of migraines.  6. Left maxillary chronic sinusitis.  7. Constipation.   DISCHARGE DIAGNOSES:  1. Triplet gestation at 14 and 2/7th weeks and status post premature      rupture of membranes, Dec 20, 2008 of fetus A.  2. History of incompetent cervix and status post a cerclage, November 24, 2008.  3. History of methicillin-resistant Staphylococcus aureus diagnosed      November 25, 2008 and status post antibiotic treatment.  4. Anemia.  5. History of migraines.  6. Left maxillary chronic sinusitis.  7. Constipation.   HOSPITAL PROCEDURES:  1. IV antibiotic therapy.  2. Indocin for uterine tocolysis.  3. Began weekly 17P injections.   CONSULTATIONS:  Maternal Fetal Medicine consult both on Dec 21, 2008 as  well as Dec 22, 2008, and social work consult on Dec 21, 2008.   HOSPITAL COURSE:  Ms. [name] is a 35 year old married black female  gravida 4, para 0-0-3-0 at 37 weeks' gestation for an [facility] of June 06, 2009 who presented on the day of admission with chief complaint of a  gush of fluid around 7:30 p.m. on the evening of May 25 while she was in  the shower and had continued clear leakage of fluid since that episode.  Following getting out of the shower, walked into the living room,  and  reported that the gushing continued and it saturated the towel between  her legs.  On admission, she denied any cramping or abdominal  tightening, denied any vaginal bleeding.  No fever or chills,  respiratory or GI complaints.  No UTI signs or symptoms.  Reported bowel  movement on the day of admission.  Reported some recent insomnia  relating to cannot get comfortable at night, had some intermittent  nausea.  She had been followed by the MD service at [facility].  Her history had been remarkable for,  1. Triplet gestation status post IVF (set of mono/di twins).  2. History of incompetent cervix status post cerclage, November 24, 2008      (approximately 12 weeks' gestation).  3. History of ectopic pregnancy in 2007 with subsequent left      salpingectomy.  4. Penicillin allergy.  5. History of migraines.  6. Left maxillary chronic sinusitis.  7. Asymptomatic bacteriuria with mother  diagnosed November 25, 2008, she      was treated with 10-day antibiotic course and urine culture Dec 16, 2008 was negative (blood cultures x2 were negative on November 25, 2008).  8. Hospitalized April 26 through April 27 at [facility] with chief complaint of right upper quadrant pain      that worsened with deep inspiration as well as some chest      tightening, had normal GI as well as cardiac workup as well as rule      out of DVT and pulmonary embolus, had some questionable asthma and      sinusitis.  Following the discharge from [facility] was      hospitalized at [facility] April 28 through      April 30 around the time of her cerclage being placed with chief      complaint of worsening migraine and sinusitis.  9. First trimester spotting.   On admission, sterile speculum exam did confirm rupture of membrane.  She had positive clear fluid pooling in vagina and positive fern.  Cerclage string was visualized and palpated.   Bimanual exam of cervix  showed that cervix was closed, approximately 50% effaced and high.  Cultures were obtained and GC/chlamydia cultures were negative as well  as GBS culture.  Her wet prep was within normal limits.  She did have a  complete OB ultrasound to further evaluate fetal well being.  Fetus A  did have heart rate of 170, did have 0 cm of largest pocket and  oligohydramnios, breech and that placenta was posterior above os.  Estimated fetal weight of baby A of 5 ounces 134 g which was 63rd  percentile but 14% discordancy.  Adjusted gestation for baby A was 15  weeks and 5 days.  Did have limited anatomy scan due to early gestation  and oligohydramnios.  Fetus B had a heart rate of 156 on ultrasound his  left fetus.  AFI within normal limits, breech.  Placenta anterior and  above cervical os, had marginal cord insertion noted.  Estimated fetal  weight 155 g which was 5 ounces greater than 75th percentile and age  adjusted on ultrasound was 16 weeks and 3 days.  Limited anatomy scan as  well for baby B, but did appear to be female.  Fetus C, right fetus, heart  rate 150.  AFI within normal limits.  Cephalic presentation.  Placenta  anterior above os.  Estimated fetal weight 146 g, 5 ounces, 71st  percentile on growth, discordancy of 6 percentile.  Age adjusted  gestational age [redacted] weeks and 1 day.  Limited anatomy as well.  __________ appeared to be female.  Cervical length measured trans-labially  was 1.5 cm.  It was not well seen, but cerclage was visualized, ovaries  were not visualized.  Just to note, the patient's cervical length in the  office at 14 and 3/7th weeks status post her cerclage was 3.19 cm.  Following initial assessment, consulted with Dr. Osborn Coho, and  plan was to admit the patient to Antenatal Unit on strict bedrest and to  begin IV erythromycin q.6 h. and to continue to observe closely.  Around  1:30 a.m., Dr. Su Hilt had gone into the patient's room to  discuss  prognosis as well as plan and the patient had a recent onset of pain  that started within  2-3 minutes of her arriving.  The patient was  requesting pain medication.  Dr. Su Hilt discussed removing cerclage  versus leaving it in place.  Risks, benefits, and alternatives were  discussed of each.  Vaginal exam closed 50% and soft.  Poor prognosis  was discussed with the patient.  The patient and her husband did not  desire at the time to leave cerclage in place at that time, but did say  if pain progressed or worsens, they would consider discussing having it  removed.  She did receive a dose of IV Stadol around that time.  Later  on the morning of the 26th, the patient was resting.  She had received  pain medicine again around 6:45 a.m., the Stadol, and at the time around  9 a.m., denied any pain or nausea.  Had still had some questionable  small amount of leaking.  Her vital signs were stable.  She remained  afebrile.  On that morning, she had a CBC drawn which showed white count  of 10.6 which was just slightly high.  Hemoglobin was 9.9, hematocrit  28.3, and platelets were 279.  Neutrophils were 66 percentile.  Her  abdomen was soft, gravid, nontender.  Fetal heart rate was 140-150 for  all three fetuses and extremities were within normal limits.  Current  care was continued and plan was made by Dr. Su Hilt to have a Maternal  Fetal Medicine consult.  Dr. Roney Mans did see the patient around  lunch time on the 26th and she did recommend adding clindamycin IV q.8  h., 2 antibiotic regimen to improve latency.  She also recommended  starting her 17P injections weekly.  She did state no signs or symptoms  of infection at that time, occasional irregular contractions, no regular  and recommended leaving her cerclage in place in situ at that time, to  remove the signs or symptoms of labor.  She also recommended a 48-hour  course of Indocin for uterine quiescence, which the patient  did receive  50 mg PR x1 and then was started on 25 mg p.o. q.6 h.  She did have a 40-  minute discussion with family regarding poor prognosis given PROM at 16  weeks and discuss with them if any signs or symptoms of labor or  clinical signs of chorio would need to deliver as well as removing  cerclage.  She did state if the patient labors without signs of chorio,  may consider interval delivery, although benefit of magnesium sulfate  for tocolysis at gestational age would not be great.  She did also state  that if the patient did not labor, poor prognosis of baby A due to  pulmonary hypoplasia.  She did also discuss that with baby B and C being  mono/di twins that they are at risk for twin-to-twin transfusion  syndrome.  Recommendation was for ultrasound at 2 weeks for growth and  fluid if the patient may remained undelivered.  She also did discuss may  be a can for an abdominal cerclage with her next pregnancy and that  preconception consultation was recommended.  Please see full dictation  for any other details of Dr. Victorino Dike [facility] consult.  The patient did  also receive a social work consult on the afternoon of May 26 as well  and please see that consult for any other clarification.  On Dec 22, 2008, the patient complained of some minor dizziness when getting up to  bedside commode, continued having clear of  leakage of fluid from vagina,  but denied any cramping or bleeding.  She did not have a bowel movement  since May 25 and did not take p.o. MiraLax that morning of a 27th.  She  remained afebrile and her vital signs were stable.  Baby A had a heart  rate of 140.  Baby B 150 and C 160.  Her physical exam was within normal  limits.  She was wearing TED hose on bilateral lower extremities for VTE  prophylaxis.  She had no fundal tenderness.  She continued receiving IV  antibiotics which were to continue for 7 days and her p.o. Indocin.  As  the day of the 27th progress, Dr.  Stefano Gaul did consult with Dr. Margot Ables  and following discussion of the patient's high-risk status, did talk  with the patient as well as her husband about the risks and benefits of  transfer to [facility] to continue care with the maternal  fetal medicine doctors.  All of her questions were answered.  Both Dr.  Stefano Gaul and the patient felt that there is chance for good outcome,  included care under the maternal fetal medicine doctors at [facility] and  transfer arrangements were made with Dr. Margot Ables who agreed to accept  the transfer and on Dec 22, 2008, the patient was transferred to [facility] for continued care under the maternal fetal medicine  specialist through [facility].      Candice Denny Levy, [place]      [name], M.D.  Electronically Signed    CHS/MEDQ  D:  12/31/2008  T:  01/01/2009  Job:  [id number]

## 2015-12-26 ENCOUNTER — Encounter (HOSPITAL_BASED_OUTPATIENT_CLINIC_OR_DEPARTMENT_OTHER): Payer: Self-pay

## 2015-12-26 ENCOUNTER — Emergency Department (HOSPITAL_BASED_OUTPATIENT_CLINIC_OR_DEPARTMENT_OTHER)
Admission: EM | Admit: 2015-12-26 | Discharge: 2015-12-26 | Disposition: A | Discharge status: Home / Self Care | Payer: 59 | Attending: Emergency Medicine | Admitting: Emergency Medicine

## 2015-12-26 DIAGNOSIS — N390 Urinary tract infection, site not specified: Secondary | ICD-10-CM | POA: Insufficient documentation

## 2015-12-26 DIAGNOSIS — R109 Unspecified abdominal pain: Secondary | ICD-10-CM | POA: Diagnosis present

## 2015-12-26 DIAGNOSIS — Z79899 Other long term (current) drug therapy: Secondary | ICD-10-CM | POA: Insufficient documentation

## 2015-12-26 LAB — COMPREHENSIVE METABOLIC PANEL
ALK PHOS: 83 U/L (ref 38–126)
ALT: 15 U/L (ref 14–54)
AST: 18 U/L (ref 15–41)
Albumin: 4.4 g/dL (ref 3.5–5.0)
Anion gap: 9 (ref 5–15)
BUN: 17 mg/dL (ref 6–20)
CALCIUM: 9.3 mg/dL (ref 8.9–10.3)
CO2: 25 mmol/L (ref 22–32)
CREATININE: 0.73 mg/dL (ref 0.44–1.00)
Chloride: 104 mmol/L (ref 101–111)
GFR calc non Af Amer: >60 mL/min (ref >60)
Glucose, Bld: 98 mg/dL (ref 65–99)
Potassium: 3.4 mmol/L — ABNORMAL LOW (ref 3.5–5.1)
SODIUM: 138 mmol/L (ref 135–145)
Total Bilirubin: 0.5 mg/dL (ref 0.3–1.2)
Total Protein: 7.5 g/dL (ref 6.5–8.1)

## 2015-12-26 LAB — URINALYSIS, ROUTINE W REFLEX MICROSCOPIC
Bilirubin Urine: NEGATIVE
Glucose, UA: NEGATIVE mg/dL
NITRITE: NEGATIVE
PROTEIN: 30 mg/dL — AB
Specific Gravity, Urine: 1.019 (ref 1.005–1.030)
pH: 6.5 (ref 5.0–8.0)

## 2015-12-26 LAB — CBC WITH DIFFERENTIAL/PLATELET
Basophils Absolute: 0 10*3/uL (ref 0.0–0.1)
Basophils Relative: 0 %
EOS ABS: 0 10*3/uL (ref 0.0–0.7)
EOS PCT: 0 %
HCT: 41.7 % (ref 36.0–46.0)
Hemoglobin: 13.9 g/dL (ref 12.0–15.0)
LYMPHS ABS: 1.4 10*3/uL (ref 0.7–4.0)
LYMPHS PCT: 18 %
MCH: 31.4 pg (ref 26.0–34.0)
MCHC: 33.3 g/dL (ref 30.0–36.0)
MCV: 94.1 fL (ref 78.0–100.0)
MONO ABS: 0.7 10*3/uL (ref 0.1–1.0)
MONOS PCT: 9 %
Neutro Abs: 5.8 10*3/uL (ref 1.7–7.7)
Neutrophils Relative %: 73 %
PLATELETS: 303 10*3/uL (ref 150–400)
RBC: 4.43 MIL/uL (ref 3.87–5.11)
RDW: 12.3 % (ref 11.5–15.5)
WBC: 8 10*3/uL (ref 4.0–10.5)

## 2015-12-26 LAB — URINE MICROSCOPIC-ADD ON

## 2015-12-26 LAB — PREGNANCY, URINE: PREG TEST UR: NEGATIVE

## 2015-12-26 LAB — LIPASE, BLOOD: Lipase: 21 U/L (ref 11–51)

## 2015-12-26 MED ORDER — CEPHALEXIN 500 MG PO CAPS: 500.0000 mg | ORAL_CAPSULE | Freq: Three times a day (TID) | ORAL | Status: DC

## 2015-12-26 MED ORDER — CEPHALEXIN 250 MG PO CAPS
1000.0000 mg | ORAL_CAPSULE | Freq: Once | ORAL | Status: AC
  Administered 2015-12-26: 1000 mg via ORAL
  Filled 2015-12-26: qty 4

## 2015-12-26 MED ORDER — HYDROCODONE-ACETAMINOPHEN 5-325 MG PO TABS
2.0000 | ORAL_TABLET | Freq: Once | ORAL | Status: AC
  Administered 2015-12-26: 2 via ORAL
  Filled 2015-12-26: qty 2

## 2015-12-26 NOTE — Discharge Instructions (Signed)
Your symptoms are likely due to urinary tract infection. You will be treated for this with antibiotics. Continue taking Motrin for your discomfort. Follow-up with your doctor next week for reevaluation. Return to ED for new or worsening symptoms.  Urinary Tract Infection Urinary tract infections (UTIs) can develop anywhere along your urinary tract. Your urinary tract is your body's drainage system for removing wastes and extra water. Your urinary tract includes two kidneys, two ureters, a bladder, and a urethra. Your kidneys are a pair of bean-shaped organs. Each kidney is about the size of your fist. They are located below your ribs, one on each side of your spine. CAUSES Infections are caused by microbes, which are microscopic organisms, including fungi, viruses, and bacteria. These organisms are so small that they can only be seen through a microscope. Bacteria are the microbes that most commonly cause UTIs. SYMPTOMS  Symptoms of UTIs may vary by age and gender of the patient and by the location of the infection. Symptoms in young women typically include a frequent and intense urge to urinate and a painful, burning feeling in the bladder or urethra during urination. Older women and men are more likely to be tired, shaky, and weak and have muscle aches and abdominal pain. A fever may mean the infection is in your kidneys. Other symptoms of a kidney infection include pain in your back or sides below the ribs, nausea, and vomiting. DIAGNOSIS To diagnose a UTI, your caregiver will ask you about your symptoms. Your caregiver will also ask you to provide a urine sample. The urine sample will be tested for bacteria and white blood cells. White blood cells are made by your body to help fight infection. TREATMENT  Typically, UTIs can be treated with medication. Because most UTIs are caused by a bacterial infection, they usually can be treated with the use of antibiotics. The choice of antibiotic and length of  treatment depend on your symptoms and the type of bacteria causing your infection. HOME CARE INSTRUCTIONS  If you were prescribed antibiotics, take them exactly as your caregiver instructs you. Finish the medication even if you feel better after you have only taken some of the medication.  Drink enough water and fluids to keep your urine clear or pale yellow.  Avoid caffeine, tea, and carbonated beverages. They tend to irritate your bladder.  Empty your bladder often. Avoid holding urine for long periods of time.  Empty your bladder before and after sexual intercourse.  After a bowel movement, women should cleanse from front to back. Use each tissue only once. SEEK MEDICAL CARE IF:   You have back pain.  You develop a fever.  Your symptoms do not begin to resolve within 3 days. SEEK IMMEDIATE MEDICAL CARE IF:   You have severe back pain or lower abdominal pain.  You develop chills.  You have nausea or vomiting.  You have continued burning or discomfort with urination. MAKE SURE YOU:   Understand these instructions.  Will watch your condition.  Will get help right away if you are not doing well or get worse.   This information is not intended to replace advice given to you by your health care provider. Make sure you discuss any questions you have with your health care provider.   Document Released: 04/24/2005 Document Revised: 04/05/2015 Document Reviewed: 08/23/2011 Elsevier Interactive Patient Education [employer]2016 [employer] Inc.

## 2015-12-26 NOTE — ED Notes (Addendum)
Right flank pain x2-3 months-n/v/d x 3 days-denies vaginal d/c and urinary s/s-NAD-steady bent over gait-pt was seen by PCP for c/o 2-3 months ago and again last week-no dx-was given pain med and sleep med

## 2015-12-26 NOTE — ED Provider Notes (Signed)
CSN: [id number]     Arrival date & time 12/26/15  2032 History   First MD Initiated Contact with Patient 12/26/15 2040     Chief Complaint  Patient presents with  . Flank Pain     (Consider location/radiation/quality/duration/timing/severity/associated sxs/prior Treatment) HPI Debbie Wolfe is a 40 y.o. female who comes in for evaluation of right-sided abdominal and flank pain. Patient reports she has had this discomfort ongoing over the past 3 months and has been evaluated by her PCP for this problem with no acute findings. She reports being prescribed pain medicine and sleep medicine for this problem. She reports her most recent pain episode started 3 days ago is characterized as a sharp, ache sensation. Symptoms will usually start shortly after eating. She reports associated nausea, vomiting and diarrhea-nonbloody and nonbilious-no melena or hematochezia. Denies fevers, chills, urinary symptoms, unusual vaginal bleeding or discharge. No other alleviating or modifying factors.  History reviewed. No pertinent past medical history. Past Surgical History  Procedure Laterality Date  . Ectopic pregnancy surgery    . Cesarean section     No family history on file. Social History  Substance Use Topics  . Smoking status: Never Smoker   . Smokeless tobacco: None  . Alcohol Use: No   OB History    No data available     Review of Systems A 10 point review of systems was completed and was negative except for pertinent positives and negatives as mentioned in the history of present illness     Allergies  Penicillins  Home Medications   Prior to Admission medications   Medication Sig Start Date End Date Taking? Authorizing Provider  TRAMADOL HCL ER PO Take by mouth.   Yes Historical Provider, MD  ZOLPIDEM TARTRATE PO Take by mouth.   Yes Historical Provider, MD  cephALEXin (KEFLEX) 500 MG capsule Take 1 capsule (500 mg total) by mouth 3 (three) times daily. 12/26/15   Pavel Gadd,  PA-C   BP 123/92 mmHg  Pulse 80  Temp(Src) 99.1 F (37.3 C) (Oral)  Resp 16  Ht  (1.499 m)  Wt 55.339 kg  BMI 24.63 kg/m2  SpO2 100%  LMP 12/19/2015 Physical Exam  Constitutional: She is oriented to person, place, and time. She appears well-developed and well-nourished.  HENT:  Head: Normocephalic and atraumatic.  Mouth/Throat: Oropharynx is clear and moist.  Eyes: Conjunctivae are normal. Pupils are equal, round, and reactive to light. Right eye exhibits no discharge. Left eye exhibits no discharge. No scleral icterus.  Neck: Neck supple.  Cardiovascular: Normal rate, regular rhythm and normal heart sounds.   Pulmonary/Chest: Effort normal and breath sounds normal. No respiratory distress. She has no wheezes. She has no rales.  Abdominal: Soft. There is no tenderness.  Abdomen is soft, nondistended without rebound or guarding. Tenderness diffusely throughout right flank and suprapubic region. No other lesions or deformities noted.  Musculoskeletal: She exhibits no tenderness.  Neurological: She is alert and oriented to person, place, and time.  Cranial Nerves II-XII grossly intact  Skin: Skin is warm and dry. No rash noted.  Psychiatric: She has a normal mood and affect.  Nursing note and vitals reviewed.   ED Course  Procedures (including critical care time) Labs Review Labs Reviewed  URINALYSIS, ROUTINE W REFLEX MICROSCOPIC (NOT AT [facility]) - Abnormal; Notable for the following:    APPearance CLOUDY (*)    Hgb urine dipstick LARGE (*)    Ketones, ur >80 (*)    Protein, ur  30 (*)    Leukocytes, UA LARGE (*)    All other components within normal limits  COMPREHENSIVE METABOLIC PANEL - Abnormal; Notable for the following:    Potassium 3.4 (*)    All other components within normal limits  URINE MICROSCOPIC-ADD ON - Abnormal; Notable for the following:    Squamous Epithelial / LPF 6-30 (*)    Bacteria, UA MANY (*)    All other components within normal limits   PREGNANCY, URINE  LIPASE, BLOOD  CBC WITH DIFFERENTIAL/PLATELET    Imaging Review No results found. I have personally reviewed and evaluated these images and lab results as part of my medical decision-making.   EKG Interpretation None     Meds given in ED:  Medications  cephALEXin (KEFLEX) capsule 1,000 mg (1,000 mg Oral Given 12/26/15 2147)  HYDROcodone-acetaminophen (NORCO/VICODIN) 5-325 MG per tablet 2 tablet (2 tablets Oral Given 12/26/15 2147)    New Prescriptions   CEPHALEXIN (KEFLEX) 500 MG CAPSULE    Take 1 capsule (500 mg total) by mouth 3 (three) times daily.   Filed Vitals:   12/26/15 2044 12/26/15 2149  BP: 145/97 123/92  Pulse: 90 80  Temp: 99.1 F (37.3 C)   TempSrc: Oral   Resp: 18 16  Height: 4\' 11"  (1.499 m)   Weight: 55.339 kg   SpO2: 100% 100%    MDM  Patient with reported 3 month history of right-sided flank pain, worse over the past 3 days. Tenderness to palpation in right flank. Otherwise unremarkable abdominal exam, no peritoneal signs. Hemodynamically stable and afebrile. Tolerating oral fluids. Screening labs are remarkable for UTI. Labs otherwise appear baseline for patient. Treated in the emergency department with antibiotics, will DC with antibiotic prescription. Follow-up with PCP for reevaluation next week. Discussed strict return precautions. Final diagnoses:  UTI (lower urinary tract infection)       Joycie PeekBenjamin Brileigh Sevcik, PA-C 12/26/15 2219  Jacalyn LefevreJulie Haviland, MD 12/26/15 2220

## 2018-06-17 ENCOUNTER — Other Ambulatory Visit: Payer: Self-pay

## 2018-06-17 ENCOUNTER — Encounter (HOSPITAL_BASED_OUTPATIENT_CLINIC_OR_DEPARTMENT_OTHER): Payer: Self-pay | Admitting: *Deleted

## 2018-06-17 ENCOUNTER — Emergency Department (HOSPITAL_BASED_OUTPATIENT_CLINIC_OR_DEPARTMENT_OTHER)
Admission: EM | Admit: 2018-06-17 | Discharge: 2018-06-17 | Disposition: A | Discharge status: Home / Self Care | Payer: 59 | Attending: Emergency Medicine | Admitting: Emergency Medicine

## 2018-06-17 ENCOUNTER — Emergency Department (HOSPITAL_BASED_OUTPATIENT_CLINIC_OR_DEPARTMENT_OTHER): Payer: 59

## 2018-06-17 DIAGNOSIS — Y9301 Activity, walking, marching and hiking: Secondary | ICD-10-CM | POA: Diagnosis not present

## 2018-06-17 DIAGNOSIS — Y998 Other external cause status: Secondary | ICD-10-CM | POA: Diagnosis not present

## 2018-06-17 DIAGNOSIS — W010XXA Fall on same level from slipping, tripping and stumbling without subsequent striking against object, initial encounter: Secondary | ICD-10-CM | POA: Insufficient documentation

## 2018-06-17 DIAGNOSIS — M62838 Other muscle spasm: Secondary | ICD-10-CM | POA: Insufficient documentation

## 2018-06-17 DIAGNOSIS — Y9289 Other specified places as the place of occurrence of the external cause: Secondary | ICD-10-CM | POA: Diagnosis not present

## 2018-06-17 DIAGNOSIS — M79601 Pain in right arm: Secondary | ICD-10-CM | POA: Diagnosis present

## 2018-06-17 MED ORDER — ACETAMINOPHEN 500 MG PO TABS
1000.0000 mg | ORAL_TABLET | Freq: Once | ORAL | Status: AC
  Administered 2018-06-17: 1000 mg via ORAL
  Filled 2018-06-17: qty 2

## 2018-06-17 MED ORDER — IBUPROFEN 800 MG PO TABS
800.0000 mg | ORAL_TABLET | Freq: Once | ORAL | Status: AC
  Administered 2018-06-17: 800 mg via ORAL
  Filled 2018-06-17: qty 1

## 2018-06-17 NOTE — ED Provider Notes (Signed)
[facility] EMERGENCY DEPARTMENT Provider Note   CSN: [id number] Arrival date & time: 06/17/18  0815     History   Chief Complaint Chief Complaint  Patient presents with  . Fall    HPI Lam D Chico is a 42 y.o. female.  42 yo F with a chief complaint of a fall.  She was walking down her son's wheelchair ramp hit the front of her house and did not know slippery and she slid down and then rolled onto her right side.  Denies head injury or loss of consciousness denies chest pain abdominal pain or back pain.  Complaining mostly of right arm pain which feels like it starts at her posterior shoulder and radiates down to her fingers.  Worse with movement and palpation.  She denies neck pain.  She is able to turn her neck without issue.  The history is provided by the patient.  Fall  This is a new problem. The current episode started 3 to 5 hours ago. The problem occurs rarely. The problem has been resolved. Pertinent negatives include no chest pain, no abdominal pain, no headaches and no shortness of breath. The symptoms are aggravated by bending and twisting. Nothing relieves the symptoms. She has tried nothing for the symptoms. The treatment provided no relief.    History reviewed. No pertinent past medical history.  There are no active problems to display for this patient.   Past Surgical History:  Procedure Laterality Date  . CESAREAN SECTION    . ECTOPIC PREGNANCY SURGERY       OB History   None      Home Medications    Prior to Admission medications   Medication Sig Start Date End Date Taking? Authorizing Provider  cephALEXin (KEFLEX) 500 MG capsule Take 1 capsule (500 mg total) by mouth 3 (three) times daily. 12/26/15   Cartner, Sharlet Salina, PA-C  TRAMADOL HCL ER PO Take by mouth.    [provider]  ZOLPIDEM TARTRATE PO Take by mouth.    [provider]    Family History History reviewed. No pertinent family history.  Social  History Social History   Tobacco Use  . Smoking status: Never Smoker  . Smokeless tobacco: Never Used  Substance Use Topics  . Alcohol use: No  . Drug use: No     Allergies   Penicillins   Review of Systems Review of Systems  Constitutional: Negative for chills and fever.  HENT: Negative for congestion and rhinorrhea.   Eyes: Negative for redness and visual disturbance.  Respiratory: Negative for shortness of breath and wheezing.   Cardiovascular: Negative for chest pain and palpitations.  Gastrointestinal: Negative for abdominal pain, nausea and vomiting.  Genitourinary: Negative for dysuria and urgency.  Musculoskeletal: Positive for arthralgias. Negative for myalgias.  Skin: Negative for pallor and wound.  Neurological: Negative for dizziness and headaches.     Physical Exam Updated Vital Signs BP (!) 153/104 (BP Location: Left Arm)   Pulse 92   Temp 98.6 F (37 C) (Oral)   Resp 18   Ht 4\' 11"  (1.499 m)   Wt 59 kg   LMP 05/27/2018 (Exact Date)   SpO2 100%   BMI 26.26 kg/m   Physical Exam  Constitutional: She is oriented to person, place, and time. She appears well-developed and well-nourished. No distress.  HENT:  Head: Normocephalic and atraumatic.  Eyes: Pupils are equal, round, and reactive to light. EOM are normal.  Neck: Normal range of motion. Neck  supple.  Cardiovascular: Normal rate and regular rhythm. Exam reveals no gallop and no friction rub.  No murmur heard. Pulmonary/Chest: Effort normal. She has no wheezes. She has no rales.  Abdominal: Soft. She exhibits no distension. There is no tenderness.  Musculoskeletal: She exhibits tenderness. She exhibits no edema.  Tenderness worse to the right trapezius closer to the attachment of the trapezius to the acromion.  She does have intact pulse motor and sensation.  No obvious signs of trauma.  She has mild tenderness to the right wrist.  Pulse motor and sensation is intact distally.  Negative  Spurling's, no midline spinal tenderness able to rotate her head 45 degrees in either direction without pain.  Neurological: She is alert and oriented to person, place, and time.  Skin: Skin is warm and dry. She is not diaphoretic.  Psychiatric: She has a normal mood and affect. Her behavior is normal.  Nursing note and vitals reviewed.    ED Treatments / Results  Labs (all labs ordered are listed, but only abnormal results are displayed) Labs Reviewed - No data to display  EKG None  Radiology Dg Shoulder Right  Result Date: 06/17/2018 CLINICAL DATA:  Acute RIGHT shoulder pain following fall. Initial encounter. EXAM: RIGHT SHOULDER - 2+ VIEW COMPARISON:  None. FINDINGS: There is no evidence of fracture or dislocation. There is no evidence of arthropathy or other focal bone abnormality. Soft tissues are unremarkable. IMPRESSION: Negative. Electronically Signed   By: Harmon Pier M.D.   On: 06/17/2018 09:09   Dg Wrist Complete Right  Result Date: 06/17/2018 CLINICAL DATA:  Acute RIGHT wrist pain following fall. Initial encounter. EXAM: RIGHT WRIST - COMPLETE 3+ VIEW COMPARISON:  04/14/2013 radiographs FINDINGS: There is no evidence of fracture or dislocation. There is no evidence of arthropathy or other focal bone abnormality. Soft tissues are unremarkable. IMPRESSION: Negative. Electronically Signed   By: Harmon Pier M.D.   On: 06/17/2018 09:11    Procedures Procedures (including critical care time)  Medications Ordered in ED Medications  acetaminophen (TYLENOL) tablet 1,000 mg (1,000 mg Oral Given 06/17/18 0902)  ibuprofen (ADVIL,MOTRIN) tablet 800 mg (800 mg Oral Given 06/17/18 0902)     Initial Impression / Assessment and Plan / ED Course  I have reviewed the triage vital signs and the nursing notes.  Pertinent labs & imaging results that were available during my care of the patient were reviewed by me and considered in my medical decision making (see chart for details).      42 yo F with a mechanical fall.  Complaining mostly of right arm pain.  Exam inconsistent with fracture and most likely this is trapezius spasm, will obtain plain films.  Plain film of the right wrist and right shoulder viewed by me without fracture, will place in a sling.   9:16 AM:  I have discussed the diagnosis/risks/treatment options with the patient and believe the pt to be eligible for discharge home to follow-up with PCP. We also discussed returning to the ED immediately if new or worsening sx occur. We discussed the sx which are most concerning (e.g., sudden worsening pain, fever, inability to tolerate by mouth) that necessitate immediate return. Medications administered to the patient during their visit and any new prescriptions provided to the patient are listed below.  Medications given during this visit Medications  acetaminophen (TYLENOL) tablet 1,000 mg (1,000 mg Oral Given 06/17/18 0902)  ibuprofen (ADVIL,MOTRIN) tablet 800 mg (800 mg Oral Given 06/17/18 0902)  The patient appears reasonably screen and/or stabilized for discharge and I doubt any other medical condition or other [facility] requiring further screening, evaluation, or treatment in the ED at this time prior to discharge.    Final Clinical Impressions(s) / ED Diagnoses   Final diagnoses:  Trapezius muscle spasm    ED Discharge Orders    None       Melene PlanFloyd, Saahil Herbster, DO 06/17/18 [id number]

## 2018-06-17 NOTE — Discharge Instructions (Signed)
Take 4 over the counter ibuprofen tablets 3 times a day or 2 over-the-counter naproxen tablets twice a day for pain. Also take tylenol 1000mg (2 extra strength) four times a day.    Your xrays were normal.  You have a sling for comfort, I do not expect you to keep your arm in the sling all day and night and you should not, at least 4 times a day need to take it out and move your arm around if you are wearing it consistently.  Follow-up with your family doctor.

## 2018-06-17 NOTE — ED Notes (Signed)
NAD at this time. Pt is stable and going home.  

## 2018-06-17 NOTE — ED Triage Notes (Signed)
Pt slid down her sons wheelchair ramp, she is hurting on her right side, from her fingers to her back.

## 2022-05-20 NOTE — Progress Notes (Signed)
 ------------------------------------------------------------------------------- Summary: Office Visit  -------------------------------------------------------------------------------  Progress Note  May 20, 2022  Chief Complaint  Patient presents with  . Annual Exam    Patient's past medical record was reviewed in Epic/Care Everywhere prior to encounter with patient today. HPI    Debbie Wolfe is a 46 y.o. female who presents for annual exam.   She is doing well. Her concerns today are regarding her BP which she states is high every time she gets a headache. Notes that she gets frequent headaches and has tried multiple OTC medications without relief.   Medication adherence: Pt reports compliance with daily medication routine.  - Pt understands goals of current medication therapy.   ROS   Review of Systems  Constitutional: Negative for chills, diaphoresis, fever, malaise/fatigue and weight loss.  HENT: Negative for congestion, ear discharge, ear pain, hearing loss, nosebleeds, sinus pain, sore throat and tinnitus.   Eyes: Negative for blurred vision, double vision, photophobia, pain, discharge and redness.  Respiratory: Negative for cough, hemoptysis, sputum production, shortness of breath, wheezing and stridor.   Cardiovascular: Negative for chest pain, palpitations, orthopnea, claudication, leg swelling and PND.  Gastrointestinal: Negative for abdominal pain, blood in stool, constipation, diarrhea, heartburn, melena, nausea and vomiting.  Genitourinary: Negative for dysuria, flank pain, frequency, hematuria and urgency.  Musculoskeletal: Negative for back pain, falls, joint pain, myalgias and neck pain.  Skin: Negative for itching and rash.  Neurological: Positive for headaches. Negative for dizziness, tingling, tremors, sensory change, speech change, focal weakness, seizures, loss of consciousness and weakness.  Endo/Heme/Allergies: Negative for environmental  allergies and polydipsia. Does not bruise/bleed easily.  Psychiatric/Behavioral: Negative for depression, hallucinations, memory loss, substance abuse and suicidal ideas. The patient is not nervous/anxious and does not have insomnia.     A complete ROS was performed with pertinent positives/negatives noted in the HPI. The remainder of the ROS are negative.    Medical History    PMH including allergies, medications, and family and social history reviewed by me and updated in Epic at time of visit today.   Physical Exam    Vitals:   05/20/22 0754 05/20/22 0818  BP: (!) 148/118 115/80  Pulse: 90   Resp: 16   SpO2: 98%   Height: 1.499 m (4' 11)   Weight: 62.6 kg (138 lb)   BMI (Calculated): 27.9    Physical Exam Constitutional:      General: She is not in acute distress.    Appearance: Normal appearance. She is not ill-appearing, toxic-appearing or diaphoretic.  HENT:     Head: Normocephalic and atraumatic.     Right Ear: Tympanic membrane, ear canal and external ear normal. There is no impacted cerumen.     Left Ear: Tympanic membrane, ear canal and external ear normal. There is no impacted cerumen.     Mouth/Throat:     Mouth: Mucous membranes are moist.     Pharynx: Oropharynx is clear. No oropharyngeal exudate or posterior oropharyngeal erythema.  Eyes:     Pupils: Pupils are equal, round, and reactive to light.  Cardiovascular:     Rate and Rhythm: Normal rate and regular rhythm.     Pulses: Normal pulses.     Heart sounds: Normal heart sounds. No murmur heard.    No friction rub. No gallop.  Pulmonary:     Effort: Pulmonary effort is normal. No respiratory distress.     Breath sounds: Normal breath sounds. No stridor. No wheezing, rhonchi or rales.  Chest:  Chest wall: No mass, lacerations, deformity, swelling, tenderness, crepitus or edema. There is no dullness to percussion.  Breasts:    Breasts are symmetrical.     Right: No swelling, bleeding, inverted nipple,  mass, nipple discharge, skin change or tenderness.     Left: No swelling, bleeding, inverted nipple, mass, nipple discharge, skin change or tenderness.     Comments: Normal Breast exam  Abdominal:     General: Abdomen is flat. Bowel sounds are normal. There is no distension.     Palpations: Abdomen is soft. There is no mass.     Tenderness: There is no abdominal tenderness. There is no right CVA tenderness, left CVA tenderness, guarding or rebound.     Hernia: No hernia is present.  Musculoskeletal:        General: No swelling, tenderness, deformity or signs of injury.     Cervical back: Normal range of motion and neck supple. No rigidity or tenderness.     Right lower leg: No edema.     Left lower leg: No edema.  Lymphadenopathy:     Cervical: No cervical adenopathy.     Upper Body:     Right upper body: No supraclavicular, axillary or pectoral adenopathy.     Left upper body: No supraclavicular, axillary or pectoral adenopathy.  Skin:    General: Skin is warm and dry.     Coloration: Skin is not jaundiced or pale.     Findings: No bruising, erythema, lesion or rash.  Neurological:     General: No focal deficit present.     Mental Status: She is alert and oriented to person, place, and time.  Psychiatric:        Mood and Affect: Mood normal.        Behavior: Behavior normal.        Thought Content: Thought content normal.        Judgment: Judgment normal.      Assessment/Plan:    1. Breast screening Ordered today  - MG MAMMOGRAM SCREENING BILATERAL COVERED; Future  2. Colon cancer screening Ordered today  - Ambulatory referral to Gastroenterology; Future  4. Routine physical examination Health maintenance up to date  Lab work today patient is fasting  Healthy lifestyle and diet discussed  Exercise discussed with the patient  Vaccinations up to date   Mammogram ordered  Pap is up to date-- late year 11/2020   - 25(OH) Vitamin D  Total; Future - CBC and  Differential; Future - Comprehensive Metabolic Panel; Future - TSH, 3rd Generation; Future - Lipid Profile; Future  5. Migraine without status migrainosus, not intractable, unspecified migraine type Patient with recurrent migraines has used multiple OTC medications without relief of her headaches. Start Imitrex  - SUMAtriptan (IMITREX) 50 MG tablet; Take one pill at the onset of a headache. If no relief of headache take a second pill two hours later.  Dispense: 12 tablet; Refill: 0  Patient is concerned about BP repeat BP was within normal limits we will have her return in 2 weeks for a nurse visit for a BP check.     Requested Prescriptions   Signed Prescriptions Disp Refills  . SUMAtriptan (IMITREX) 50 MG tablet 12 tablet 0    Sig: Take one pill at the onset of a headache. If no relief of headache take a second pill two hours later.    Orders Placed This Encounter  Procedures  . MG MAMMOGRAM SCREENING BILATERAL COVERED    Standing Status:  Future    Standing Expiration Date:   07/20/2024    Order Specific Question:   Preferred Scheduling Location    Answer:   [facility]  . 25(OH) Vitamin D  Total    Standing Status:   Future    Standing Expiration Date:   08/17/2023    Order Specific Question:   Result Release to patient:    Answer:   Immediate  . CBC and Differential    Standing Status:   Future    Standing Expiration Date:   08/17/2023    Order Specific Question:   Result Release to patient:    Answer:   Immediate  . Comprehensive Metabolic Panel    Standing Status:   Future    Standing Expiration Date:   08/17/2023    Order Specific Question:   Result Release to patient:    Answer:   Immediate  . TSH, 3rd Generation    Standing Status:   Future    Standing Expiration Date:   08/17/2023    Order Specific Question:   Result Release to patient:    Answer:   Immediate  . Lipid Profile    Standing Status:   Future    Standing Expiration Date:   08/06/2023    Order Specific  Question:   Result Release to patient:    Answer:   Immediate  . Ambulatory referral to Gastroenterology    Standing Status:   Future    Standing Expiration Date:   05/21/2023    Referral Priority:   Routine    Referral Type:   Consultation    Referral Reason:   Specialty Services Required    Number of Visits Requested:   1   Results for orders placed or performed in visit on 12/18/20  Comprehensive Metabolic Panel  Result Value Ref Range   Sodium 139 135 - 146 MMOL/L   Potassium 3.7 3.5 - 5.3 MMOL/L   Chloride 105 98 - 110 MMOL/L   CO2 27 23 - 30 MMOL/L   BUN 15 8 - 24 MG/DL   Glucose 80 70 - 99 MG/DL   Creatinine 9.23 9.49 - 1.50 MG/DL   Calcium 9.1 8.5 - 89.4 MG/DL   Total Protein 6.7 6.0 - 8.3 G/DL   Albumin  4.3 3.5 - 5.0 G/DL   Total Bilirubin 0.6 0.1 - 1.2 MG/DL   Alkaline Phosphatase 79 25 - 125 IU/L or U/L   AST (SGOT) 15 5 - 40 IU/L or U/L   ALT (SGPT) 13 5 - 50 IU/L or U/L   Anion Gap 7 4 - 14 MMOL/L   Est. GFR >90 >=60 ML/MIN/1.73 M*2   TSH, 3rd Generation  Result Value Ref Range   TSH 1.36 0.45 - 5.00 UIU/ML  Lipid Profile  Result Value Ref Range   LDL Direct 91 <130 mg/dL   Total Cholesterol 849 25 - 199 MG/DL   Triglycerides 45 10 - 150 MG/DL   HDL Cholesterol 48 35 - 135 MG/DL   Total Chol / HDL Cholesterol 3.1 <4.5   Non-HDL Cholesterol 102 MG/DL   Coronary Heart Disease Risk Table    25(OH) Vitamin D  Total  Result Value Ref Range   Total Vitamin D  25-Hydroxy 14 (L) 30 - 100 ng/mL  CBC and Differential  Result Value Ref Range   WBC 4.4 4.4 - 11.0 x 10*3/uL   RBC 4.53 4.10 - 5.10 x 10*6/uL   Hemoglobin 13.7 12.3 - 15.3 G/DL   Hematocrit 41.1  35.9 - 44.6 %   MCV 90.7 80.0 - 96.0 FL   MCH 30.3 27.5 - 33.2 PG   MCHC 33.4 33.0 - 37.0 G/DL   RDW 86.8 %   Platelets 248 150 - 450 X 10*3/uL   MPV 9.7 6.8 - 10.2 FL   Neutrophil % 39 %   Lymphocyte % 49 %   Monocyte % 8 %   Eosinophil % 3 %   Basophil % 1 %   Neutrophil Absolute 1.7 (L) 1.8 - 7.8 x  10*3/uL   Lymphocyte Absolute 2.2 1.0 - 4.8 x 10*3/uL   Monocyte Absolute 0.4 0.0 - 0.8 x 10*3/uL   Eosinophil Absolute 0.1 0.0 - 0.5 x 10*3/uL   Basophil Absolute 0.0 0.0 - 0.2 x 10*3/uL    Instructions and side effects for new medications were discussed. All questions answered at today's visit.   Patient instructed to return to clinic sooner or seek urgent or emergent care for problems with medications or new or worsening symptoms including but not limited to fever, chills, nausea, vomiting, diarrhea, chest pain and shortness of breath.   Return in about 1 year (around 05/21/2023) for Physical exam.  Electronically signed by:  [address] [place], 05/20/2022 7:54 AM       Electronically signed by: Latisha Ronal Crank, PA-C 05/20/22 [phone]

## 2023-12-26 NOTE — H&P (Signed)
 Gastroenterology Preprocedural History and Physical     Chief Complaint/Reason for Procedure: Debbie Wolfe is a 48 y.o. female scheduled for a Colonoscopy, for the following indication Colon Cancer Screening using deep sedation with propofol or general anesthesia as per anesthesia provider .  A History and Physical has been performed and patient medication allergies have been reviewed. The patient's tolerance of previous anesthesia has been reviewed. The risks and benefits of the procedure and the sedation options and risks were discussed with the patient. All questions were answered and informed consent obtained.  HPI  Past Medical History:  Diagnosis Date  . Abnormal Pap smear of cervix    resolved without treatment  . Ectopic pregnancy (CMD)   . Migraine without aura   . STD (sexually transmitted disease)    trichomonas    Past Surgical History:  Procedure Laterality Date  . CESAREAN SECTION, UNSPECIFIED     Procedure: CESAREAN SECTION  . DILATION AND CURETTAGE OF UTERUS     Procedure: DILATION AND CURETTAGE OF UTERUS  . ECTOPIC PREGNANCY     Procedure: ECTOPIC PREGNANCY SURGERY; left salpingectomy  . HYSTEROSCOPY     Procedure: HYSTEROSCOPY    Family History  Problem Relation Name Age of Onset  . Bone cancer Mother    . Alzheimer's disease Maternal Grandmother    . Breast cancer Neg Hx      Social History   Socioeconomic History  . Marital status: Married    Spouse name: Not on file  . Number of children: Not on file  . Years of education: Not on file  . Highest education level: Not on file  Occupational History  . Not on file  Tobacco Use  . Smoking status: Never  . Smokeless tobacco: Never  Substance and Sexual Activity  . Alcohol use: No  . Drug use: No  . Sexual activity: Not on file  Other Topics Concern  . Not on file  Social History Narrative  . Not on file   Social Drivers of Health   Food Insecurity: No Food Insecurity (05/19/2022)    Received from [facility] visits prior to 09/28/2022.   Food vital sign   . Within the past 12 months, you worried that your food would run out before you got money to buy more: Never true   . Within the past 12 months, the food you bought just didn't last and you didn't have money to get more: Never true  Transportation Needs: No Transportation Needs (05/19/2022)   PRAPARE - Transportation   . Lack of Transportation (Medical): No   . Lack of Transportation (Non-Medical): No  Safety: Not on file  Living Situation: Unknown (05/19/2022)   Received from [facility] visits prior to 09/28/2022.   Housing Stability Vital Sign   . Unable to Pay for Housing in the Last Year: No   . Number of Places Lived in the Last Year: Not on file   . In the last 12 months, was there a time when you did not have a steady place to sleep or slept in a shelter (including now)?: No    Meds Ordered in [place]  Medication Sig Dispense Refill  . ergocalciferol  (VITAMIN D2) 1,250 mcg (50,000 unit) capsule Take 50,000 Units by mouth every 7 days for 90 days. 12 capsule 0  . SUMAtriptan (IMITREX) 50 mg tablet Take one pill at the onset of a headache. If no relief of headache take a second  pill two hours later. 12 tablet 0   No current Epic-ordered facility-administered medications on file.    Allergies  Allergen Reactions  . Penicillins Rash      Physical Exam:  There were no vitals filed for this visit. There is no height or weight on file to calculate BMI.  Airway:  MALLAMPATI TWO   Heart:  normal S1 and S2 Lungs:  clear Abdomen:  soft, nontender, normal bowel sounds Mental Status:  awake and alert; oriented to person, place, and time     ASA Grade Assessment: ASA 2 - Patient with mild systemic disease with no functional limitations   I have reviewed patient's health history and patient is cleared to proceed with the proposed procedure at this facility.    Tri Chris Daniels, MD

## 2023-12-26 NOTE — H&P (Signed)
 The patient may be discharged from the care of  Endo Unit when they consistently maintain acceptable vital signs and consciousness recovery scores, and satisfactorily fulfill applicable components of the discharge checklist. Please notify me or other qualified procedural team members to reevaluate the patient if this does not occur within 2 hours of beginning the recovery period or immediately if any emergency arises.    Patients who required active interventions to support hemodynamics, oxygenation, and/or ventilation during the procedural sedation continuum (such as, but not limited to reversal agent administration, addition of new supplemental oxygen administration during the sedation recovery process, or vasopressor/fluid bolus support of blood pressure) must be accompanied by the recovery area nurse during transport to their hospital room to provide continuous clinical and physiologic monitoring.

## 2024-06-22 ENCOUNTER — Other Ambulatory Visit: Payer: Self-pay

## 2024-06-22 ENCOUNTER — Inpatient Hospital Stay (HOSPITAL_BASED_OUTPATIENT_CLINIC_OR_DEPARTMENT_OTHER)
Admission: EM | Admit: 2024-06-22 | Discharge: 2024-06-24 | DRG: 872 | Disposition: A | Discharge status: Home / Self Care | Attending: Internal Medicine | Admitting: Internal Medicine

## 2024-06-22 ENCOUNTER — Encounter (HOSPITAL_BASED_OUTPATIENT_CLINIC_OR_DEPARTMENT_OTHER): Payer: Self-pay

## 2024-06-22 DIAGNOSIS — N751 Abscess of Bartholin's gland: Principal | ICD-10-CM | POA: Diagnosis present

## 2024-06-22 DIAGNOSIS — A419 Sepsis, unspecified organism: Principal | ICD-10-CM | POA: Diagnosis present

## 2024-06-22 DIAGNOSIS — R21 Rash and other nonspecific skin eruption: Secondary | ICD-10-CM | POA: Diagnosis present

## 2024-06-22 DIAGNOSIS — D72829 Elevated white blood cell count, unspecified: Secondary | ICD-10-CM

## 2024-06-22 DIAGNOSIS — E876 Hypokalemia: Secondary | ICD-10-CM | POA: Diagnosis present

## 2024-06-22 DIAGNOSIS — R200 Anesthesia of skin: Secondary | ICD-10-CM | POA: Diagnosis present

## 2024-06-22 DIAGNOSIS — N750 Cyst of Bartholin's gland: Secondary | ICD-10-CM | POA: Diagnosis present

## 2024-06-22 DIAGNOSIS — Z79899 Other long term (current) drug therapy: Secondary | ICD-10-CM

## 2024-06-22 DIAGNOSIS — Z91040 Latex allergy status: Secondary | ICD-10-CM

## 2024-06-22 DIAGNOSIS — E872 Acidosis, unspecified: Secondary | ICD-10-CM | POA: Diagnosis present

## 2024-06-22 DIAGNOSIS — Z880 Allergy status to penicillin: Secondary | ICD-10-CM

## 2024-06-22 DIAGNOSIS — R000 Tachycardia, unspecified: Secondary | ICD-10-CM

## 2024-06-22 LAB — CBC WITH DIFFERENTIAL/PLATELET
Abs Immature Granulocytes: 0.03 K/uL (ref 0.00–0.07)
Basophils Absolute: 0 K/uL (ref 0.0–0.1)
Basophils Relative: 0 %
Eosinophils Absolute: 0 K/uL (ref 0.0–0.5)
Eosinophils Relative: 0 %
HCT: 45.3 % (ref 36.0–46.0)
Hemoglobin: 15.1 g/dL — ABNORMAL HIGH (ref 12.0–15.0)
Immature Granulocytes: 0 %
Lymphocytes Relative: 18 %
Lymphs Abs: 2.6 K/uL (ref 0.7–4.0)
MCH: 31.5 pg (ref 26.0–34.0)
MCHC: 33.3 g/dL (ref 30.0–36.0)
MCV: 94.6 fL (ref 80.0–100.0)
Monocytes Absolute: 0.8 K/uL (ref 0.1–1.0)
Monocytes Relative: 6 %
Neutro Abs: 10.6 K/uL — ABNORMAL HIGH (ref 1.7–7.7)
Neutrophils Relative %: 76 %
Platelets: 297 K/uL (ref 150–400)
RBC: 4.79 MIL/uL (ref 3.87–5.11)
RDW: 11.9 % (ref 11.5–15.5)
WBC: 14.1 K/uL — ABNORMAL HIGH (ref 4.0–10.5)
nRBC: 0 % (ref 0.0–0.2)

## 2024-06-22 LAB — COMPREHENSIVE METABOLIC PANEL WITH GFR
ALT: 17 U/L (ref 0–44)
AST: 26 U/L (ref 15–41)
Albumin: 4.7 g/dL (ref 3.5–5.0)
Alkaline Phosphatase: 114 U/L (ref 38–126)
Anion gap: 14 (ref 5–15)
BUN: 16 mg/dL (ref 6–20)
CO2: 26 mmol/L (ref 22–32)
Calcium: 9.2 mg/dL (ref 8.9–10.3)
Chloride: 101 mmol/L (ref 98–111)
Creatinine, Ser: 0.83 mg/dL (ref 0.44–1.00)
GFR, Estimated: >60 mL/min (ref >60)
Glucose, Bld: 98 mg/dL (ref 70–99)
Potassium: 3.3 mmol/L — ABNORMAL LOW (ref 3.5–5.1)
Sodium: 140 mmol/L (ref 135–145)
Total Bilirubin: 0.6 mg/dL (ref 0.0–1.2)
Total Protein: 8 g/dL (ref 6.5–8.1)

## 2024-06-22 LAB — URINALYSIS, W/ REFLEX TO CULTURE (INFECTION SUSPECTED)
Bilirubin Urine: NEGATIVE
Glucose, UA: NEGATIVE mg/dL
Ketones, ur: 40 mg/dL — AB
Nitrite: NEGATIVE
Protein, ur: 30 mg/dL — AB
Specific Gravity, Urine: 1.025 (ref 1.005–1.030)
pH: 7 (ref 5.0–8.0)

## 2024-06-22 LAB — LACTIC ACID, PLASMA
Lactic Acid, Venous: 2.1 mmol/L (ref 0.5–1.9)
Lactic Acid, Venous: 1 mmol/L (ref 0.5–1.9)

## 2024-06-22 LAB — PREGNANCY, URINE: Preg Test, Ur: NEGATIVE

## 2024-06-22 MED ORDER — METRONIDAZOLE 500 MG/100ML IV SOLN
500.0000 mg | Freq: Once | INTRAVENOUS | Status: AC
  Administered 2024-06-22: 500 mg via INTRAVENOUS
  Filled 2024-06-22: qty 100

## 2024-06-22 MED ORDER — ONDANSETRON HCL 4 MG/2ML IJ SOLN: 4.0000 mg | Freq: Once | INTRAMUSCULAR | Status: AC

## 2024-06-22 MED ORDER — KETOROLAC TROMETHAMINE 30 MG/ML IJ SOLN
30.0000 mg | Freq: Once | INTRAMUSCULAR | Status: AC
  Administered 2024-06-22: 30 mg via INTRAVENOUS
  Filled 2024-06-22: qty 1

## 2024-06-22 MED ORDER — SODIUM CHLORIDE 0.9 % IV SOLN
1.0000 g | Freq: Once | INTRAVENOUS | Status: AC
  Administered 2024-06-22: 1 g via INTRAVENOUS
  Filled 2024-06-22: qty 10

## 2024-06-22 MED ORDER — VANCOMYCIN HCL IN DEXTROSE 1-5 GM/200ML-% IV SOLN
1000.0000 mg | Freq: Once | INTRAVENOUS | Status: AC
  Administered 2024-06-22: 1000 mg via INTRAVENOUS
  Filled 2024-06-22: qty 200

## 2024-06-22 MED ORDER — ONDANSETRON HCL 4 MG/2ML IJ SOLN
INTRAMUSCULAR | Status: AC
  Administered 2024-06-22: 4 mg via INTRAVENOUS
  Filled 2024-06-22: qty 2

## 2024-06-22 MED ORDER — POTASSIUM CHLORIDE CRYS ER 20 MEQ PO TBCR
20.0000 meq | EXTENDED_RELEASE_TABLET | Freq: Once | ORAL | Status: AC
  Administered 2024-06-22: 20 meq via ORAL
  Filled 2024-06-22: qty 1

## 2024-06-22 MED ORDER — LACTATED RINGERS IV BOLUS
1000.0000 mL | Freq: Once | INTRAVENOUS | Status: AC
  Administered 2024-06-22: 1000 mL via INTRAVENOUS

## 2024-06-22 MED ORDER — LIDOCAINE-EPINEPHRINE 2 %-1:100000 IJ SOLN: 10.0000 mL | Freq: Once | INTRAMUSCULAR | Status: DC

## 2024-06-22 MED ORDER — ACETAMINOPHEN 500 MG PO TABS
1000.0000 mg | ORAL_TABLET | Freq: Once | ORAL | Status: DC
  Filled 2024-06-22: qty 2

## 2024-06-22 MED ORDER — LIDOCAINE-EPINEPHRINE 1 %-1:100000 IJ SOLN
10.0000 mL | Freq: Once | INTRAMUSCULAR | Status: AC
  Administered 2024-06-22: 10 mL via INTRADERMAL
  Filled 2024-06-22: qty 1

## 2024-06-22 NOTE — Progress Notes (Signed)
 ED Pharmacy Antibiotic Sign Off An antibiotic consult was received from an ED provider for vancomycin  per pharmacy dosing for wound infection. A chart review was completed to assess appropriateness.  A single dose of ceftriaxone  and flagyl  placed by the ED provider.   The following one time order(s) were placed per pharmacy consult:  vancomycin  1000 mg x 1 dose  Further antibiotic and/or antibiotic pharmacy consults should be ordered by the admitting provider if indicated.   Thank you for allowing pharmacy to be a part of this patient's care.   Dorn Buttner, PharmD, BCPS 06/22/2024 8:20 PM ED Clinical Pharmacist -  [phone]

## 2024-06-22 NOTE — ED Provider Notes (Signed)
 [place] [facility] Provider Note   CSN: [id number] Arrival date & time: 06/22/24  8251     Patient presents with: Numbness and Fever   Debbie Wolfe is a 48 y.o. female.  {Add pertinent medical, surgical, social history, OB history to HPI:32947} HPI     48yo female presents with concern for numbness of bilateral lower extremities, back pain, labial swelling and abscess.    Legs numb for about one week, just the legs, started in bottom of feet then spread up, had seen doctor, does not have PCP but like a sports medicine Dr. on [address].  At that time feels like it was more left sided.  Was given medrol dosepak and tizanidine . Then given volteren gel---this was actually back in September.  Started toes a few years ago, then thought it was pain related to surgery then started getting worse, thought it was nerve damange from toe/foot surgery years ago 1 week ago started feeling it in legs, groin area Has back pain, has been there for a while, works as Doctor, Hospital. No weakness in legs, no loss of control of bowel or bladder Legs just numb, not hurting. Toes do have pain and have for a long time.   Groin area and swelling began 3 days ago.  Each day swelling more. Has had bartholins abscess in the past she believes.  No vaginal discharge or bleeding.  This time not having drainage yet but just swelling. Fever/chills started last night Nausea, no vomiting No diarrhea No cough congestion or sore throat  History reviewed. No pertinent past medical history.  Prior to Admission medications   Medication Sig Start Date End Date Taking? Authorizing Provider  cephALEXin  (KEFLEX ) 500 MG capsule Take 1 capsule (500 mg total) by mouth 3 (three) times daily. 12/26/15   Cartner, Morene, PA-C  TRAMADOL HCL ER PO Take by mouth.    [provider]  ZOLPIDEM TARTRATE PO Take by mouth.    [provider]    Allergies: Penicillins     Review of Systems  Updated Vital Signs BP 120/80   Pulse 91   Temp 99.6 F (37.6 C) (Oral)   Resp 18   Ht 4' 11 (1.499 m)   Wt 58.5 kg   SpO2 95%   BMI 26.05 kg/m   Physical Exam Vitals and nursing note reviewed.  Constitutional:      General: She is not in acute distress.    Appearance: She is well-developed. She is not diaphoretic.  HENT:     Head: Normocephalic and atraumatic.  Eyes:     Conjunctiva/sclera: Conjunctivae normal.  Cardiovascular:     Rate and Rhythm: Normal rate and regular rhythm.     Heart sounds: Normal heart sounds. No murmur heard.    No friction rub. No gallop.  Pulmonary:     Effort: Pulmonary effort is normal. No respiratory distress.     Breath sounds: Normal breath sounds. No wheezing or rales.  Abdominal:     General: There is no distension.     Palpations: Abdomen is soft.     Tenderness: There is no abdominal tenderness. There is no guarding.  Genitourinary:    Comments: 4cm right bartholin's gland swelling, tenderness Musculoskeletal:        General: No tenderness.     Cervical back: Normal range of motion.  Skin:    General: Skin is warm and dry.     Findings: No erythema  or rash.  Neurological:     Mental Status: She is alert and oriented to person, place, and time.     (all labs ordered are listed, but only abnormal results are displayed) Labs Reviewed  LACTIC ACID, PLASMA - Abnormal; Notable for the following components:      Result Value   Lactic Acid, Venous 2.1 (*)    All other components within normal limits  COMPREHENSIVE METABOLIC PANEL WITH GFR - Abnormal; Notable for the following components:   Potassium 3.3 (*)    All other components within normal limits  CBC WITH DIFFERENTIAL/PLATELET - Abnormal; Notable for the following components:   WBC 14.1 (*)    Hemoglobin 15.1 (*)    Neutro Abs 10.6 (*)    All other components within normal limits  URINALYSIS, W/ REFLEX TO CULTURE (INFECTION SUSPECTED) - Abnormal;  Notable for the following components:   APPearance HAZY (*)    Hgb urine dipstick MODERATE (*)    Ketones, ur 40 (*)    Protein, ur 30 (*)    Leukocytes,Ua TRACE (*)    Bacteria, UA FEW (*)    All other components within normal limits  CULTURE, BLOOD (ROUTINE X 2)  CULTURE, BLOOD (ROUTINE X 2)  LACTIC ACID, PLASMA  PREGNANCY, URINE    EKG: None  Radiology: No results found.  {Document cardiac monitor, telemetry assessment procedure when appropriate:32947} Procedures   Medications Ordered in the ED  lidocaine -EPINEPHrine  (XYLOCAINE  W/EPI) 2 %-1:100000 (with pres) injection 10 mL (10 mLs Other Not Given 06/22/24 1901)  ketorolac  (TORADOL ) 30 MG/ML injection 30 mg (30 mg Intravenous Given 06/22/24 1858)  potassium chloride  SA (KLOR-CON  M) CR tablet 20 mEq (20 mEq Oral Given 06/22/24 1900)  lidocaine -EPINEPHrine  (XYLOCAINE  W/EPI) 1 %-1:100000 (with pres) injection 10 mL (10 mLs Intradermal Given by Other 06/22/24 1907)  ondansetron  (ZOFRAN ) injection 4 mg (4 mg Intravenous Given 06/22/24 2020)  lactated ringers  bolus 1,000 mL (0 mLs Intravenous Stopped 06/22/24 2126)  cefTRIAXone  (ROCEPHIN ) 1 g in sodium chloride  0.9 % 100 mL IVPB (0 g Intravenous Stopped 06/22/24 2126)  metroNIDAZOLE  (FLAGYL ) IVPB 500 mg (0 mg Intravenous Stopped 06/22/24 2209)  vancomycin  (VANCOCIN ) IVPB 1000 mg/200 mL premix (0 mg Intravenous Stopped 06/22/24 2314)      {Click here for ABCD2, HEART and other calculators REFRESH Note before signing:1}                               48yo female presents with concern for numbness of bilateral lower extremities, back pain, labial swelling and abscess.    Bartholin's gland abscess: Drained and word catheter placed  Arrives with fever of 100.6, tachycardia.  In setting of Bartholin's gland abscess, suspect this is likely the source of her fever, tachycardia and leukocytosis.  Labs completed personally by and interpreted by me show leukocytosis of 14,100, mild  hypokalemia, normal renal function, lactic acid of 2.1.  Given systemic symptoms, signs of sepsis, will admit for IV antibiotics.  Given IV fluid, vancomycin , Rocephin  and Flagyl  in setting of penicillin allergy.  Regarding her back pain and numbness: This appears to be an acute on chronic problem with some history of toe numbness starting years ago, back pain years ago, however new bilateral full lower extremity numbness beginning 1 week ago.  She has normal strength, no loss of control of her bowel or bladder, have low suspicion for cauda equina.  Consider epidural abscess as etiology of back  pain, fever, and numbness, but do feel this is less likely in the setting of Bartholin's gland abscess is likely source of her fever.  She has normal to hyperreflexive patellar reflexes, low suspicion for Guillain-Barr syndrome.  Differential diagnosis for her lower extremity numbness includes transverse myelitis, MS, abscess, mass, disc disease, vitamin deficiencies, syphilis.  Have placed orders for MRI WWO contrast T/L spine to be performed while she is inpatient.      {Document critical care time when appropriate  Document review of labs and clinical decision tools ie [id number], etc  Document your independent review of radiology images and any outside records  Document your discussion with family members, caretakers and with consultants  Document social determinants of health affecting pt's care  Document your decision making why or why not admission, treatments were needed:32947:::1}   Final diagnoses:  None    ED Discharge Orders     None

## 2024-06-22 NOTE — ED Triage Notes (Addendum)
 Pt states she has had bilateral leg numbness for a week, had this a month ago and it resolved. States she has discoloration under both big toes that she noticed a week ago. Pt now has swelling in vaginal area that started 3 days ago, denies vaginal discharge or other issues. Pt states she had 20mL of liquid tylenol  on her way here

## 2024-06-23 ENCOUNTER — Observation Stay (HOSPITAL_COMMUNITY)

## 2024-06-23 DIAGNOSIS — R200 Anesthesia of skin: Secondary | ICD-10-CM | POA: Diagnosis not present

## 2024-06-23 DIAGNOSIS — N751 Abscess of Bartholin's gland: Secondary | ICD-10-CM | POA: Diagnosis not present

## 2024-06-23 DIAGNOSIS — A419 Sepsis, unspecified organism: Secondary | ICD-10-CM | POA: Insufficient documentation

## 2024-06-23 DIAGNOSIS — E876 Hypokalemia: Secondary | ICD-10-CM | POA: Insufficient documentation

## 2024-06-23 LAB — COMPREHENSIVE METABOLIC PANEL WITH GFR
ALT: 16 U/L (ref 0–44)
AST: 21 U/L (ref 15–41)
Albumin: 2.9 g/dL — ABNORMAL LOW (ref 3.5–5.0)
Alkaline Phosphatase: 73 U/L (ref 38–126)
Anion gap: 8 (ref 5–15)
BUN: 8 mg/dL (ref 6–20)
CO2: 23 mmol/L (ref 22–32)
Calcium: 8 mg/dL — ABNORMAL LOW (ref 8.9–10.3)
Chloride: 107 mmol/L (ref 98–111)
Creatinine, Ser: 0.74 mg/dL (ref 0.44–1.00)
GFR, Estimated: >60 mL/min (ref >60)
Glucose, Bld: 111 mg/dL — ABNORMAL HIGH (ref 70–99)
Potassium: 3.5 mmol/L (ref 3.5–5.1)
Sodium: 138 mmol/L (ref 135–145)
Total Bilirubin: 0.6 mg/dL (ref 0.0–1.2)
Total Protein: 5.4 g/dL — ABNORMAL LOW (ref 6.5–8.1)

## 2024-06-23 LAB — VITAMIN B12: Vitamin B-12: 219 pg/mL (ref 180–914)

## 2024-06-23 LAB — CBC
HCT: 35.8 % — ABNORMAL LOW (ref 36.0–46.0)
Hemoglobin: 12 g/dL (ref 12.0–15.0)
MCH: 31.4 pg (ref 26.0–34.0)
MCHC: 33.5 g/dL (ref 30.0–36.0)
MCV: 93.7 fL (ref 80.0–100.0)
Platelets: 241 K/uL (ref 150–400)
RBC: 3.82 MIL/uL — ABNORMAL LOW (ref 3.87–5.11)
RDW: 12.1 % (ref 11.5–15.5)
WBC: 13.7 K/uL — ABNORMAL HIGH (ref 4.0–10.5)
nRBC: 0 % (ref 0.0–0.2)

## 2024-06-23 LAB — VITAMIN D 25 HYDROXY (VIT D DEFICIENCY, FRACTURES): Vit D, 25-Hydroxy: 19.07 ng/mL — ABNORMAL LOW (ref 30–100)

## 2024-06-23 LAB — HIV ANTIBODY (ROUTINE TESTING W REFLEX): HIV Screen 4th Generation wRfx: NONREACTIVE

## 2024-06-23 MED ORDER — SODIUM CHLORIDE 0.9% FLUSH
3.0000 mL | Freq: Two times a day (BID) | INTRAVENOUS | Status: DC
  Administered 2024-06-23 (×2): 3 mL via INTRAVENOUS

## 2024-06-23 MED ORDER — SODIUM CHLORIDE 0.9 % IV SOLN: 2.0000 g | Freq: Three times a day (TID) | INTRAVENOUS | Status: DC

## 2024-06-23 MED ORDER — ACETAMINOPHEN 325 MG PO TABS
650.0000 mg | ORAL_TABLET | Freq: Four times a day (QID) | ORAL | Status: DC | PRN
  Administered 2024-06-23: 650 mg via ORAL
  Filled 2024-06-23: qty 2

## 2024-06-23 MED ORDER — ONDANSETRON HCL 4 MG/2ML IJ SOLN
4.0000 mg | Freq: Four times a day (QID) | INTRAMUSCULAR | Status: DC | PRN
  Administered 2024-06-23: 4 mg via INTRAVENOUS
  Filled 2024-06-23: qty 2

## 2024-06-23 MED ORDER — LACTATED RINGERS IV SOLN: INTRAVENOUS | Status: DC

## 2024-06-23 MED ORDER — SODIUM CHLORIDE 0.9 % IV SOLN: 250.0000 mL | INTRAVENOUS | Status: DC | PRN

## 2024-06-23 MED ORDER — LACTATED RINGERS IV BOLUS
1000.0000 mL | Freq: Once | INTRAVENOUS | Status: AC
  Administered 2024-06-23: 1000 mL via INTRAVENOUS

## 2024-06-23 MED ORDER — HYDROMORPHONE HCL 1 MG/ML IJ SOLN
0.5000 mg | Freq: Once | INTRAMUSCULAR | Status: AC
  Administered 2024-06-23: 0.5 mg via INTRAVENOUS
  Filled 2024-06-23: qty 1

## 2024-06-23 MED ORDER — METRONIDAZOLE 500 MG PO TABS
500.0000 mg | ORAL_TABLET | Freq: Two times a day (BID) | ORAL | Status: DC
  Administered 2024-06-23 – 2024-06-24 (×2): 500 mg via ORAL
  Filled 2024-06-23 (×2): qty 1

## 2024-06-23 MED ORDER — SODIUM CHLORIDE 0.9% FLUSH: 3.0000 mL | INTRAVENOUS | Status: DC | PRN

## 2024-06-23 MED ORDER — VANCOMYCIN HCL 500 MG/100ML IV SOLN
500.0000 mg | Freq: Two times a day (BID) | INTRAVENOUS | Status: DC
Start: 2024-06-23 — End: 2024-06-24
  Administered 2024-06-23 (×2): 500 mg via INTRAVENOUS
  Filled 2024-06-23 (×3): qty 100

## 2024-06-23 MED ORDER — METRONIDAZOLE 500 MG/100ML IV SOLN
500.0000 mg | Freq: Two times a day (BID) | INTRAVENOUS | Status: DC
  Administered 2024-06-23: 500 mg via INTRAVENOUS
  Filled 2024-06-23: qty 100

## 2024-06-23 MED ORDER — SODIUM CHLORIDE 0.9 % IV SOLN
2.0000 g | Freq: Three times a day (TID) | INTRAVENOUS | Status: DC
  Administered 2024-06-23 – 2024-06-24 (×4): 2 g via INTRAVENOUS
  Filled 2024-06-23 (×4): qty 12.5

## 2024-06-23 MED ORDER — ACETAMINOPHEN 650 MG RE SUPP: 650.0000 mg | Freq: Four times a day (QID) | RECTAL | Status: DC | PRN

## 2024-06-23 MED ORDER — GADOBUTROL 1 MMOL/ML IV SOLN
6.0000 mL | Freq: Once | INTRAVENOUS | Status: AC | PRN
  Administered 2024-06-23: 6 mL via INTRAVENOUS

## 2024-06-23 MED ORDER — ONDANSETRON HCL 4 MG PO TABS: 4.0000 mg | ORAL_TABLET | Freq: Four times a day (QID) | ORAL | Status: DC | PRN

## 2024-06-23 MED ORDER — ENOXAPARIN SODIUM 40 MG/0.4ML IJ SOSY
40.0000 mg | PREFILLED_SYRINGE | INTRAMUSCULAR | Status: DC
  Filled 2024-06-23: qty 0.4

## 2024-06-23 NOTE — Consult Note (Signed)
 OB/GYN Consult Note  Referring Provider: Owen Lore, MD  Debbie Wolfe is a 48 y.o. G4P2 (twins) admitted for leg numbness.  During her evaluation it was noted that the patient had a right bartholin's gland cyst which the ER physician drained and placed a ward catheter  Gyn consulted for recommendations regarding bartholin's management.   Pt had 1 week history of pelvic pain and leg numbness.  Leg numbness has actually been pre-existing.  Bartholin's gland cyst had I and D with catheter placement on 06/22/24.  Pt denies history of diabetes or immunosuppression.      History reviewed. No pertinent past medical history.  Past Surgical History:  Procedure Laterality Date   CESAREAN SECTION     ECTOPIC PREGNANCY SURGERY      OB History  No obstetric history on file.    Social History   Socioeconomic History   Marital status: Married    Spouse name: Not on file   Number of children: Not on file   Years of education: Not on file   Highest education level: Not on file  Occupational History   Not on file  Tobacco Use   Smoking status: Never   Smokeless tobacco: Never  Substance and Sexual Activity   Alcohol use: No   Drug use: No   Sexual activity: Yes    Birth control/protection: None  Other Topics Concern   Not on file  Social History Narrative   Not on file   Social Drivers of Health   Financial Resource Strain: Not on file  Food Insecurity: No Food Insecurity (06/23/2024)   Hunger Vital Sign    Worried About Running Out of Food in the Last Year: Never true    Ran Out of Food in the Last Year: Never true  Transportation Needs: No Transportation Needs (06/23/2024)   PRAPARE - Administrator, Civil Service (Medical): No    Lack of Transportation (Non-Medical): No  Physical Activity: Not on file  Stress: Not on file  Social Connections: Not on file    History reviewed. No pertinent family history.  Medications Prior to Admission  Medication  Sig Dispense Refill Last Dose/Taking   acetaminophen  (TYLENOL ) 500 MG tablet Take 1,000 mg by mouth 2 (two) times daily as needed for headache (pain).   06/22/2024 Evening   Aspirin-Acetaminophen -Caffeine (GOODYS EXTRA STRENGTH PO) Take 1 packet by mouth 2 (two) times daily as needed (pain, headache).   06/22/2024 Noon    Allergies  Allergen Reactions   Latex Dermatitis    Testing done 06/06/2016-results pending   Penicillins Rash    Review of Systems: Negative except for what is mentioned in HPI.     Physical Exam: BP (!) 142/93 (BP Location: Right Arm)   Pulse 86   Temp 98.2 F (36.8 C) (Oral)   Resp 18   Ht 4' 11 (1.499 m)   Wt 58.5 kg   SpO2 100%   BMI 26.05 kg/m  CONSTITUTIONAL: Well-developed, well-nourished female in no acute distress.  HENT:  Normocephalic, atraumatic, External right and left ear normal. Oropharynx is clear and moist EYES: Conjunctivae and EOM are normal.  NECK: Normal range of motion, supple, no masses SKIN: Skin is warm and dry. No rash noted. Not diaphoretic. No erythema. No pallor. NEUROLGIC: Alert and oriented to person, place, and time. Normal reflexes, muscle tone coordination. No cranial nerve deficit noted. PSYCHIATRIC: Normal mood and affect. Normal behavior. Normal judgment and thought content. CARDIOVASCULAR: Normal heart rate noted,  regular rhythm RESPIRATORY: Effort and breath sounds normal, no problems with respiration noted ABDOMEN: Soft, nontender, nondistended PELVIC: with chaperone present, right labia noted to have mild edema with ward catheter in place.  Good placement noted, but somewhat tenuous. No drainage, irritation or appearance of erythema MUSCULOSKELETAL: Normal range of motion. No edema and no tenderness. 2+ distal pulses.   Pertinent Labs/Studies:   Results for orders placed or performed during the hospital encounter of 06/22/24 (from the past 72 hours)  Lactic acid, plasma     Status: Abnormal   Collection Time:  06/22/24  6:03 PM  Result Value Ref Range   Lactic Acid, Venous 2.1 (HH) 0.5 - 1.9 mmol/L    Comment: Critical Value, Read Back and verified with Sim All RN at 1916 on 06/22/24 by I.Sugut Performed at [facility], [address] [place], [place] [address]   Comprehensive metabolic panel     Status: Abnormal   Collection Time: 06/22/24  6:03 PM  Result Value Ref Range   Sodium 140 135 - 145 mmol/L   Potassium 3.3 (L) 3.5 - 5.1 mmol/L   Chloride 101 98 - 111 mmol/L   CO2 26 22 - 32 mmol/L   Glucose, Bld 98 70 - 99 mg/dL    Comment: Glucose reference range applies only to samples taken after fasting for at least 8 hours.   BUN 16 6 - 20 mg/dL   Creatinine, Ser 9.16 0.44 - 1.00 mg/dL   Calcium 9.2 8.9 - 89.6 mg/dL   Total Protein 8.0 6.5 - 8.1 g/dL   Albumin 4.7 3.5 - 5.0 g/dL   AST 26 15 - 41 U/L   ALT 17 0 - 44 U/L   Alkaline Phosphatase 114 38 - 126 U/L   Total Bilirubin 0.6 0.0 - 1.2 mg/dL   GFR, Estimated >39 >39 mL/min    Comment: (NOTE) Calculated using the CKD-EPI Creatinine Equation (2021)    Anion gap 14 5 - 15    Comment: Performed at [facility], [address] [place], [place] [address]  CBC with Differential     Status: Abnormal   Collection Time: 06/22/24  6:03 PM  Result Value Ref Range   WBC 14.1 (H) 4.0 - 10.5 K/uL   RBC 4.79 3.87 - 5.11 MIL/uL   Hemoglobin 15.1 (H) 12.0 - 15.0 g/dL   HCT 54.6 63.9 - 53.9 %   MCV 94.6 80.0 - 100.0 fL   MCH 31.5 26.0 - 34.0 pg   MCHC 33.3 30.0 - 36.0 g/dL   RDW 88.0 88.4 - 84.4 %   Platelets 297 150 - 400 K/uL   nRBC 0.0 0.0 - 0.2 %   Neutrophils Relative % 76 %   Neutro Abs 10.6 (H) 1.7 - 7.7 K/uL   Lymphocytes Relative 18 %   Lymphs Abs 2.6 0.7 - 4.0 K/uL   Monocytes Relative 6 %   Monocytes Absolute 0.8 0.1 - 1.0 K/uL   Eosinophils Relative 0 %   Eosinophils Absolute 0.0 0.0 - 0.5 K/uL   Basophils Relative 0 %   Basophils Absolute 0.0 0.0 - 0.1 K/uL   Immature Granulocytes 0 %    Abs Immature Granulocytes 0.03 0.00 - 0.07 K/uL    Comment: Performed at [facility], [address] [place], [place] [address]  Urinalysis, w/ Reflex to Culture (Infection Suspected) -Urine, Clean Catch     Status: Abnormal   Collection Time: 06/22/24  6:03  PM  Result Value Ref Range   Specimen Source URINE, CLEAN CATCH    Color, Urine YELLOW YELLOW   APPearance HAZY (A) CLEAR   Specific Gravity, Urine 1.025 1.005 - 1.030   pH 7.0 5.0 - 8.0   Glucose, UA NEGATIVE NEGATIVE mg/dL   Hgb urine dipstick MODERATE (A) NEGATIVE   Bilirubin Urine NEGATIVE NEGATIVE   Ketones, ur 40 (A) NEGATIVE mg/dL   Protein, ur 30 (A) NEGATIVE mg/dL   Nitrite NEGATIVE NEGATIVE   Leukocytes,Ua TRACE (A) NEGATIVE   Squamous Epithelial / HPF 6-10 0 - 5 /HPF   WBC, UA 6-10 0 - 5 WBC/hpf    Comment: Reflex urine culture not performed if WBC <=10, OR if Squamous epithelial cells >5. If Squamous epithelial cells >5, suggest recollection.   RBC / HPF 11-20 0 - 5 RBC/hpf   Bacteria, UA FEW (A) NONE SEEN   Mucus PRESENT     Comment: Performed at [facility], [address] [place], [place] [address]  Pregnancy, urine     Status: None   Collection Time: 06/22/24  6:03 PM  Result Value Ref Range   Preg Test, Ur NEGATIVE NEGATIVE    Comment:        THE SENSITIVITY OF THIS METHODOLOGY IS >20 mIU/mL. Performed at [facility], [address] [place], [place] [address]   Blood culture (routine x 2)     Status: None (Preliminary result)   Collection Time: 06/22/24  8:12 PM   Specimen: BLOOD  Result Value Ref Range   Specimen Description      BLOOD Blood Culture adequate volume Performed at [facility], [address] [place], [place] [address]    Special Requests      BOTTLES DRAWN AEROBIC AND ANAEROBIC BLOOD LEFT FOREARM Performed at [facility], [address] [place], [place] [address]    Culture      NO GROWTH < 12 HOURS Performed at [facility], [address] [place], [place] [address]    Report Status PENDING   Lactic acid, plasma     Status: None   Collection Time: 06/22/24  9:58 PM  Result Value Ref Range   Lactic Acid, Venous 1.0 0.5 - 1.9 mmol/L    Comment: Performed at [facility], [address] [place], [place] [address]  Vitamin B12     Status: None   Collection Time: 06/23/24  6:07 AM  Result Value Ref Range   Vitamin B-12 219 180 - 914 pg/mL    Comment: (NOTE) This assay is not validated for testing neonatal or myeloproliferative syndrome specimens for Vitamin B12 levels. Performed at [facility], [address] [place], [place] [address]   VITAMIN D  25 Hydroxy (Vit-D Deficiency, Fractures)     Status: Abnormal   Collection Time: 06/23/24  6:07 AM  Result Value Ref Range   Vit D, 25-Hydroxy 19.07 (L) 30 - 100 ng/mL    Comment: (NOTE) Vitamin D  deficiency has been defined by the Institute of Medicine  and an Endocrine Society practice guideline as a level of serum 25-OH  vitamin D  less than 20 ng/mL (1,2). The Endocrine Society went on to  further define vitamin D  insufficiency as a level between 21 and 29  ng/mL (2).  1. IOM (Institute of Medicine). 2010. Dietary reference intakes for  calcium and D. [place]: [employer].  2. Holick MF, Binkley [place], Bischoff-Ferrari HA, et al. Evaluation,  treatment, and prevention of vitamin D  deficiency: an Endocrine  Society clinical practice guideline, JCEM. 2011 Jul; 96(7): 1911-30.  Performed at [facility], [address], [place], [place] [address]   CBC     Status: Abnormal   Collection Time: 06/23/24  6:08 AM  Result Value Ref Range   WBC 13.7 (H) 4.0 - 10.5 K/uL   RBC 3.82 (L) 3.87 - 5.11 MIL/uL   Hemoglobin 12.0 12.0 - 15.0 g/dL   HCT 64.1 (L) 63.9 - 53.9 %   MCV 93.7 80.0 - 100.0 fL   MCH 31.4 26.0 - 34.0 pg   MCHC 33.5 30.0 - 36.0 g/dL   RDW 87.8 88.4 - 84.4 %   Platelets 241 150 - 400  K/uL   nRBC 0.0 0.0 - 0.2 %    Comment: Performed at [facility], [address] [place], [place] [address]  Comprehensive metabolic panel     Status: Abnormal   Collection Time: 06/23/24  6:08 AM  Result Value Ref Range   Sodium 138 135 - 145 mmol/L   Potassium 3.5 3.5 - 5.1 mmol/L   Chloride 107 98 - 111 mmol/L   CO2 23 22 - 32 mmol/L   Glucose, Bld 111 (H) 70 - 99 mg/dL    Comment: Glucose reference range applies only to samples taken after fasting for at least 8 hours.   BUN 8 6 - 20 mg/dL   Creatinine, Ser 9.25 0.44 - 1.00 mg/dL   Calcium 8.0 (L) 8.9 - 10.3 mg/dL   Total Protein 5.4 (L) 6.5 - 8.1 g/dL   Albumin 2.9 (L) 3.5 - 5.0 g/dL   AST 21 15 - 41 U/L   ALT 16 0 - 44 U/L   Alkaline Phosphatase 73 38 - 126 U/L   Total Bilirubin 0.6 0.0 - 1.2 mg/dL   GFR, Estimated >39 >39 mL/min    Comment: (NOTE) Calculated using the CKD-EPI Creatinine Equation (2021)    Anion gap 8 5 - 15    Comment: Performed at [facility], [address] [place], [place] [address]  HIV Antibody (routine testing w rflx)     Status: None   Collection Time: 06/23/24  6:08 AM  Result Value Ref Range   HIV Screen 4th Generation wRfx Non Reactive Non Reactive    Comment: Performed at [facility], [address], [place], [place] [address]       Assessment and Plan :Debbie Wolfe is a 48 y.o. G4P2  admitted for leg numbness and incidental finding of bartholin's gland cyst, now drained  Recommend leaving catheter for 3-4 weeks, then remove. May give 5-7 days of cephalosporin if desired such as keflex  or duricef, pt is not absolutely necessary, will leave information regarding outpatient follow up with [facility] in [facility] per pt request.   Thank you for this consult, we will sign off, please call or re-consult with further questions.   For OB/GYN questions, please call the Center for [facility] at [facility] Monday - Friday, 8 am - 5 pm: [phone] All other times: [phone]    Jerilynn Buddle, M.D.  Attending Obstetrician & Gynecologist, [facility] for [employer], [facility]

## 2024-06-23 NOTE — Progress Notes (Signed)
 Patient had an I and D done in [facility] ED, she came with word catheter, no drain noted.

## 2024-06-23 NOTE — Progress Notes (Signed)
 Orthopedic Tech Progress Note Patient Details:  Debbie Wolfe [birth year] [id number]  Ortho Devices Type of Ortho Device: Thoracolumbar corset (TLSO) Ortho Device/Splint Interventions: Application, Adjustment, Removal, Ordered   Post Interventions Patient Tolerated: Well Instructions Provided: Adjustment of device, Care of device  [name] OTR/L 06/23/2024, 11:55 AM

## 2024-06-23 NOTE — Plan of Care (Signed)

## 2024-06-23 NOTE — TOC CM/SW Note (Signed)
 Transition of Care [facility]) - Inpatient Brief Assessment   Patient Details  Name: Debbie Wolfe MRN: [id number] Date of Birth: 1975-09-28  Transition of Care [facility]) CM/SW Contact:    Tom-Johnson, Harvest Muskrat, RN Phone Number: 06/23/2024, 12:05 PM   Clinical Narrative:  Patient presented to the ED with bilateral Leg Numbness x1 week which resolved, discoloration to bilateral big toes and swelling in Vaginal area. Vaginal exam in the ED showed Bartholin's Gland Abscess which was drained in the ED and a drain placed. On IV abx.  CM spoke with patient at bedside about needs for post hospital transition. Patient lives with her two children Employed, independent with care and drive self. Does not have DME's at home. Does not have PCP. Patient given choice of Clinics, has no preference. CM scheduled new patient establishment, info on AVS. Uses [facility] on [address] in [place].   Patient not Medically ready for discharge.  CM will continue to follow as patient progresses with care towards discharge.        Transition of Care Asessment: Insurance and Status: Insurance coverage has been reviewed Patient has primary care physician: No (New patient establishment scheduled.) Home environment has been reviewed: Yes Prior level of function:: Independent Prior/Current Home Services: No current home services Social Drivers of Health Review: SDOH reviewed no interventions necessary Readmission risk has been reviewed: Yes Transition of care needs: transition of care needs identified, TOC will continue to follow

## 2024-06-23 NOTE — Progress Notes (Signed)
 PROGRESS NOTE    KIYLA RINGLER  FMW:[id number] DOB: 1976-06-10 DOA: 06/22/2024 PCP: Patient, No Pcp Per   Brief Narrative: 48 year old with no significant past medical history presents to the ED complaining of vaginal swelling and pain as well as bilateral lower extremity weakness that started 1 week ago.  Patient was noted to be mildly febrile.  No incontinence of urine or bowel.   Vaginal exam showed Bartholin gland abscess which actually was a spontaneously draining, ER physician drainage further and placed the catheter and discussed with on-call OB gynecology.  They will consult in the morning.  Due to concern for lower extremity numbness weakness MRI was ordered.  Patient also reports a rash in the abdomen, left arm and right leg since Monday.    Assessment & Plan:   Principal Problem:   Lower extremity numbness Active Problems:   Bartholin's gland abscess   Sepsis (HCC)   Hypokalemia   1-Bartholin gland abscess Sepsis secondary to Bartholin gland abscess, POA -Patient presented with swelling of vaginal area for 3 days and fever at home. - She was found to be tachycardic, febrile, leukocytosis, elevated lactic acid. - OB/GYN  consulted. Dr Zina will see in consultation.  - IV fluids. - Continue IV antibiotics. Might need to be discharge with drain in placed. Usually can be remove in 4 weeks per GYN/.  -Follow Blood culture.    Bilateral lower extremity numbness and weakness: -Patient reported bilateral lower extremity numbness for 1 week and weakness for 1 month.  Does not have any bowel or bladder dysfunction. - Physical exam bilateral muscle strength 5/5 - MRI lumbar spine no stenosis of the neuroforamina - MRI of the thoracic spine no stenosis.  Mild multilevel disc space narrowing and minimal disc bulge at T4-T5 through T9-T10 without significant stenosis. MRI discussed with Neurosurgeon, this finding couldn't explain numbness.  -Will check B 12, Vitamin D .   -Discussed with neurology, patient can follow up out patient for nerve conduction study.    Maculopapular rash: -improved.           Estimated body mass index is 26.05 kg/m as calculated from the following:   Height as of this encounter: 4' 11 (1.499 m).   Weight as of this encounter: 58.5 kg.   DVT prophylaxis: Lovenox  Code Status: Full code Family Communication: Care discussed with patient Disposition Plan:  Status is: Observation The patient will require care spanning > 2 midnights and should be moved to inpatient because: Management of infection and abscess    Consultants:  Gynecology  Procedures:  I&D by ED physician Antimicrobials:    Subjective: Patient seen this morning, she is feeling much better, feels the swelling in her pubic area has decreased.  She also reports resolution of numbness and tingling lower extremity.  Denies any weakness currently.  Objective: Vitals:   06/22/24 2314 06/23/24 0154 06/23/24 0250 06/23/24 0741  BP: 120/80 125/73 (!) 148/93 112/75  Pulse: 91 91 72 77  Resp: 18 18 18 18   Temp:  99.1 F (37.3 C) 98.7 F (37.1 C) 98.8 F (37.1 C)  TempSrc:  Oral Oral Oral  SpO2: 95% 98% 100% 97%  Weight:      Height:        Intake/Output Summary (Last 24 hours) at 06/23/2024 0807 Last data filed at 06/23/2024 9370 Gross per 24 hour  Intake 2528.17 ml  Output --  Net 2528.17 ml   Filed Weights   06/22/24 1800  Weight: 58.5 kg  Examination:  General exam: Appears calm and comfortable  Respiratory system: Clear to auscultation. Respiratory effort normal. Cardiovascular system: S1 & S2 heard, RRR.  Gastrointestinal system: Abdomen is nondistended, soft and nontender. No organomegaly or masses felt. Normal bowel sounds heard. Central nervous system: Alert and oriented.  Extremities: Symmetric 5 x 5 power.   Data Reviewed: I have personally reviewed following labs and imaging studies  CBC: Recent Labs  Lab  06/22/24 1803 06/23/24 0608  WBC 14.1* 13.7*  NEUTROABS 10.6*  --   HGB 15.1* 12.0  HCT 45.3 35.8*  MCV 94.6 93.7  PLT 297 241   Basic Metabolic Panel: Recent Labs  Lab 06/22/24 1803 06/23/24 0608  NA 140 138  K 3.3* 3.5  CL 101 107  CO2 26 23  GLUCOSE 98 111*  BUN 16 8  CREATININE 0.83 0.74  CALCIUM 9.2 8.0*   GFR: Estimated Creatinine Clearance: 66.9 mL/min (by C-G formula based on SCr of 0.74 mg/dL). Liver Function Tests: Recent Labs  Lab 06/22/24 1803 06/23/24 0608  AST 26 21  ALT 17 16  ALKPHOS 114 73  BILITOT 0.6 0.6  PROT 8.0 5.4*  ALBUMIN 4.7 2.9*   No results for input(s): LIPASE, AMYLASE in the last 168 hours. No results for input(s): AMMONIA in the last 168 hours. Coagulation Profile: No results for input(s): INR, PROTIME in the last 168 hours. Cardiac Enzymes: No results for input(s): CKTOTAL, CKMB, CKMBINDEX, TROPONINI in the last 168 hours. BNP (last 3 results) No results for input(s): PROBNP in the last 8760 hours. HbA1C: No results for input(s): HGBA1C in the last 72 hours. CBG: No results for input(s): GLUCAP in the last 168 hours. Lipid Profile: No results for input(s): CHOL, HDL, LDLCALC, TRIG, CHOLHDL, LDLDIRECT in the last 72 hours. Thyroid Function Tests: No results for input(s): TSH, T4TOTAL, FREET4, T3FREE, THYROIDAB in the last 72 hours. Anemia Panel: No results for input(s): VITAMINB12, FOLATE, FERRITIN, TIBC, IRON, RETICCTPCT in the last 72 hours. Sepsis Labs: Recent Labs  Lab 06/22/24 1803 06/22/24 2158  LATICACIDVEN 2.1* 1.0    Recent Results (from the past 240 hours)  Blood culture (routine x 2)     Status: None (Preliminary result)   Collection Time: 06/22/24  8:12 PM   Specimen: BLOOD  Result Value Ref Range Status   Specimen Description   Final    BLOOD Blood Culture adequate volume Performed at [facility], [address] [place], [place]  [address]    Special Requests   Final    BOTTLES DRAWN AEROBIC AND ANAEROBIC BLOOD LEFT FOREARM Performed at [facility], [address] [place], [place] [address]    Culture   Final    NO GROWTH < 12 HOURS Performed at [facility], [address], [place], [place] [address]    Report Status PENDING  Incomplete         Radiology Studies: MR Lumbar Spine W Wo Contrast Result Date: 06/23/2024 EXAM: MRI LUMBAR SPINE 06/23/2024 04:16:22 AM TECHNIQUE: Multiplanar multisequence MRI of the lumbar spine was performed without and with the administration of intravenous contrast. COMPARISON: None available. CLINICAL HISTORY: Low back pain, infection suspected, no prior imaging FINDINGS: BONES AND ALIGNMENT: Normal alignment. Normal vertebral body heights. Bone marrow signal is unremarkable. SPINAL CORD: The conus terminates normally. SOFT TISSUES: No paraspinal mass. L1-L2: No significant disc herniation. No spinal canal stenosis or neural foraminal narrowing. L2-L3: No significant disc herniation. No spinal canal stenosis  or neural foraminal narrowing. L3-L4: No significant disc herniation. No spinal canal stenosis or neural foraminal narrowing. L4-L5: No significant disc herniation. No spinal canal stenosis or neural foraminal narrowing. L5-S1: No significant disc herniation. No spinal canal stenosis or neural foraminal narrowing. IMPRESSION: 1. No spinal canal stenosis or neural foraminal narrowing in the lumbar spine. Electronically signed by: Evalene Coho MD 06/23/2024 04:39 AM EST RP Workstation: [id number]   MR THORACIC SPINE W WO CONTRAST Result Date: 06/23/2024 EXAM: MRI THORACIC SPINE WITH AND WITHOUT INTRAVENOUS CONTRAST 06/23/2024 04:16:00 AM TECHNIQUE: Multiplanar multisequence MRI of the thoracic spine was performed with and without the administration of intravenous contrast. COMPARISON: None available. CLINICAL HISTORY: Mid-back pain, neuro deficit. FINDINGS: BONES AND  ALIGNMENT: Normal alignment. Normal vertebral body heights. Bone marrow signal is unremarkable. No abnormal enhancement. SPINAL CORD: The spinal cord is normal in morphology, volume, and signal intensity. There is no abnormal enhancement. SOFT TISSUES: Unremarkable. DEGENERATIVE CHANGES: There is mild disc space narrowing and minimal disc bulging at T4-T5 through T9-T10. There is no significant spinal canal or neural foraminal stenosis present at any of these levels. The spinal canal and neural foramina are widely patent throughout the thoracic spine. IMPRESSION: 1. No spinal canal stenosis or neural foraminal narrowing in the thoracic spine. 2. Mild multilevel disc space narrowing and minimal disc bulging at T4-5 through T9-10 without significant stenosis. Electronically signed by: Evalene Coho MD 06/23/2024 04:34 AM EST RP Workstation: [id number]        Scheduled Meds:  enoxaparin  (LOVENOX ) injection  40 mg Subcutaneous Q24H   lidocaine -EPINEPHrine   10 mL Other Once   sodium chloride  flush  3 mL Intravenous Q12H   sodium chloride  flush  3 mL Intravenous Q12H   Continuous Infusions:  sodium chloride      ceFEPime  (MAXIPIME ) IV Stopped (06/23/24 0610)   lactated ringers  125 mL/hr at 06/23/24 0629   metronidazole      vancomycin        LOS: 0 days    Time spent: 35 Minutes    Ciarrah Rae A Janelly Switalski, MD [facility]   If 7PM-7AM, please contact night-coverage [email or web address]  06/23/2024, 8:07 AM

## 2024-06-23 NOTE — Plan of Care (Signed)
  Problem: Clinical Measurements: Goal: Will remain free from infection Outcome: Not Progressing   Problem: Clinical Measurements: Goal: Cardiovascular complication will be avoided Outcome: Not Progressing   Problem: Pain Managment: Goal: General experience of comfort will improve and/or be controlled Outcome: Not Progressing

## 2024-06-23 NOTE — Progress Notes (Signed)
 Pharmacy Antibiotic Note  Debbie Wolfe is a 48 y.o. female admitted on 06/22/2024 with numbness of bilateral lower extremities/back pain, and labial swelling and abscess. Pharmacy has been consulted for cefepime /vancomycin  dosing given patient meets sepsis criteria. 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, no vaginal discharge, no drainage at this time  -Abscess has been drained with placement of word catheter -WBC 14, sCr 0.83 (~bl), Tmax 100.6 -Blood cultures collected; no STI panel or UCx -MRI pending   Plan: -Cefepime  2g IV every 8 hours -Vancomycin  1g IV x1 -Vancomycin  500mg  IV every 12 hours (AUC 462, IBW, Vd 0.72, sCr 0.83) -Monitor renal function -Follow up signs of clinical improvement, LOT, de-escalation of antibiotics   Height: 4' 11 (149.9 cm) Weight: 58.5 kg (129 lb) IBW/kg (Calculated) : 43.2  Temp (24hrs), Avg:99.5 F (37.5 C), Min:98.7 F (37.1 C), Max:100.6 F (38.1 C)  Recent Labs  Lab 06/22/24 1803 06/22/24 2158  WBC 14.1*  --   CREATININE 0.83  --   LATICACIDVEN 2.1* 1.0    Estimated Creatinine Clearance: 64.5 mL/min (by C-G formula based on SCr of 0.83 mg/dL).    Allergies  Allergen Reactions   Penicillins Rash    Antimicrobials this admission: Cefepime  11/25 >>  Vancomycin  11/25 >>   Microbiology results: 11/25 BCx:   Thank you for allowing pharmacy to be a part of this patient's care.  Lynwood Poplar, PharmD, BCPS Clinical Pharmacist 06/23/2024 4:01 AM

## 2024-06-23 NOTE — Progress Notes (Signed)
 Patient called this nurse stating that word catheter fell off while she was urinating, per handoff with RN Meiji, if it falls off then nothing to be done. Patient stated she fell more comfortable once it fell off.

## 2024-06-23 NOTE — Progress Notes (Signed)
 DR. Gifford informed of word catheter falling off, this nurse was asked to inform OB, Dr. Jayne informed and he said It's fine.

## 2024-06-23 NOTE — Progress Notes (Signed)
 Patient going for MRI via wheelchair.Dr lee updated about Rashes on abdomen and on her left wrist and right leg.

## 2024-06-23 NOTE — H&P (Addendum)
 History and Physical    Debbie Wolfe FMW:[id number] DOB: April 08, 1976 DOA: 06/22/2024  PCP: Patient, No Pcp Per   Patient coming from: Home   Chief Complaint:  Chief Complaint  Patient presents with   Numbness   Fever   ED TRIAGE note:Pt states she has had bilateral leg numbness for a week, had this a month ago and it resolved. States she has discoloration under both big toes that she noticed a week ago. Pt now has swelling in vaginal area that started 3 days ago, denies vaginal discharge or other issues. Pt states she had 20mL of liquid tylenol  on her way here   HPI:  Debbie Wolfe is a 48 y.o. female with significant past medical history presented to emergency department complaining of vaginal swelling and pain as well as complaining about bilateral lower extremity weakness ongoing for 1 week.   In the ER patient is mildly febrile.  Patient does not have any weakness of the lower extremities or incontinence of urine or bowel.  Vaginal exam showed Bartholin's gland abscess which actually was spontaneously draining and ER physician drained it further and placed a catheter and discussed with on-call OB/GYN Dr. Jayne lovely will see patient in consult in the daytime  Started on empiric antibiotics.  The concern is for the lower extremity numbness and MRI has been ordered to make sure there is no acute discitis or epidural but ER physician feels that chances are very low and patient does not have any cauda equina syndrome.  Patient also reported rash in the abdomen, left arm and right leg since Monday.  During my evaluation at the bedside patient reported that she has some tingling numbness of the bilateral lower extremity 1 week ago which has been resolved and has experienced intermittent weakness for almost 1 month.   At presentation to ED patient is febrile.  Initial lactic acid 2.1 which has been improved to 1.  Blood cultures are in process.  Pregnancy test negative.  CBC showed  leukocytosis 14.1 otherwise unremarkable.  CMP showed a potassium 3.3.  In the ED patient received ceftriaxone  and vancomycin .  Patient has been transferred from [facility] to [facility] for further evaluation management of bilateral lower extremity weakness and Bartholin gland cyst/abscess.     Significant labs in the ED: Lab Orders         Blood culture (routine x 2)         Lactic acid, plasma         Comprehensive metabolic panel         CBC with Differential         Urinalysis, w/ Reflex to Culture (Infection Suspected) -Urine, Clean Catch         Pregnancy, urine         HIV Antibody (routine testing w rflx)         CBC         Comprehensive metabolic panel       Review of Systems:  Review of Systems  Constitutional:  Positive for fever and malaise/fatigue. Negative for chills and weight loss.  Eyes:  Negative for blurred vision.  Respiratory:  Negative for cough, sputum production and shortness of breath.   Cardiovascular:  Negative for chest pain.  Gastrointestinal:  Negative for abdominal pain, blood in stool, constipation, diarrhea, heartburn, melena, nausea and vomiting.  Genitourinary:  Negative for dysuria, flank pain, frequency, hematuria and urgency.  Musculoskeletal:  Negative for back pain, falls,  joint pain, myalgias and neck pain.  Neurological:  Positive for weakness. Negative for dizziness, tingling, tremors, sensory change, speech change, focal weakness, seizures, loss of consciousness and headaches.       Bilateral lower extremity weakness and numbness  Psychiatric/Behavioral:  The patient is not nervous/anxious.     History reviewed. No pertinent past medical history.  Past Surgical History:  Procedure Laterality Date   CESAREAN SECTION     ECTOPIC PREGNANCY SURGERY       reports that she has never smoked. She has never used smokeless tobacco. She reports that she does not drink alcohol and does not use drugs.  Allergies   Allergen Reactions   Penicillins Rash    History reviewed. No pertinent family history.  Prior to Admission medications   Medication Sig Start Date End Date Taking? Authorizing Provider  cephALEXin  (KEFLEX ) 500 MG capsule Take 1 capsule (500 mg total) by mouth 3 (three) times daily. 12/26/15   Cartner, Morene, PA-C  TRAMADOL HCL ER PO Take by mouth.    [provider]  ZOLPIDEM TARTRATE PO Take by mouth.    [provider]     Physical Exam: Vitals:   06/22/24 2211 06/22/24 2314 06/23/24 0154 06/23/24 0250  BP:  120/80 125/73 (!) 148/93  Pulse:  91 91 72  Resp:  18 18 18   Temp: 99.6 F (37.6 C)  99.1 F (37.3 C) 98.7 F (37.1 C)  TempSrc: Oral  Oral Oral  SpO2:  95% 98% 100%  Weight:      Height:        Physical Exam Vitals and nursing note reviewed.  Constitutional:      General: She is not in acute distress.    Appearance: She is not ill-appearing.  HENT:     Mouth/Throat:     Mouth: Mucous membranes are moist.  Eyes:     Pupils: Pupils are equal, round, and reactive to light.  Cardiovascular:     Rate and Rhythm: Normal rate and regular rhythm.     Pulses: Normal pulses.     Heart sounds: Normal heart sounds.  Pulmonary:     Breath sounds: Normal breath sounds.  Abdominal:     Palpations: Abdomen is soft.  Musculoskeletal:     Cervical back: Neck supple.     Right lower leg: No edema.     Left lower leg: No edema.  Skin:    General: Skin is warm.     Capillary Refill: Capillary refill takes less than 2 seconds.     Coloration: Skin is not jaundiced or pale.     Findings: Rash present. No bruising, erythema or lesion.  Neurological:     General: No focal deficit present.     Mental Status: She is alert and oriented to person, place, and time.     Cranial Nerves: No cranial nerve deficit.     Sensory: No sensory deficit.     Motor: No weakness.     Coordination: Coordination normal.     Gait: Gait normal.     Deep Tendon Reflexes:  Reflexes normal.  Psychiatric:        Mood and Affect: Mood normal.      Labs on Admission: I have personally reviewed following labs and imaging studies  CBC: Recent Labs  Lab 06/22/24 1803  WBC 14.1*  NEUTROABS 10.6*  HGB 15.1*  HCT 45.3  MCV 94.6  PLT 297   Basic Metabolic Panel: Recent Labs  Lab  06/22/24 1803  NA 140  K 3.3*  CL 101  CO2 26  GLUCOSE 98  BUN 16  CREATININE 0.83  CALCIUM 9.2   GFR: Estimated Creatinine Clearance: 64.5 mL/min (by C-G formula based on SCr of 0.83 mg/dL). Liver Function Tests: Recent Labs  Lab 06/22/24 1803  AST 26  ALT 17  ALKPHOS 114  BILITOT 0.6  PROT 8.0  ALBUMIN 4.7   No results for input(s): LIPASE, AMYLASE in the last 168 hours. No results for input(s): AMMONIA in the last 168 hours. Coagulation Profile: No results for input(s): INR, PROTIME in the last 168 hours. Cardiac Enzymes: No results for input(s): CKTOTAL, CKMB, CKMBINDEX, TROPONINI, TROPONINIHS in the last 168 hours. BNP (last 3 results) No results for input(s): BNP in the last 8760 hours. HbA1C: No results for input(s): HGBA1C in the last 72 hours. CBG: No results for input(s): GLUCAP in the last 168 hours. Lipid Profile: No results for input(s): CHOL, HDL, LDLCALC, TRIG, CHOLHDL, LDLDIRECT in the last 72 hours. Thyroid Function Tests: No results for input(s): TSH, T4TOTAL, FREET4, T3FREE, THYROIDAB in the last 72 hours. Anemia Panel: No results for input(s): VITAMINB12, FOLATE, FERRITIN, TIBC, IRON, RETICCTPCT in the last 72 hours. Urine analysis:    Component Value Date/Time   COLORURINE YELLOW 06/22/2024 1803   APPEARANCEUR HAZY (A) 06/22/2024 1803   LABSPEC 1.025 06/22/2024 1803   PHURINE 7.0 06/22/2024 1803   GLUCOSEU NEGATIVE 06/22/2024 1803   HGBUR MODERATE (A) 06/22/2024 1803   BILIRUBINUR NEGATIVE 06/22/2024 1803   KETONESUR 40 (A) 06/22/2024 1803   PROTEINUR 30 (A) 06/22/2024  1803   UROBILINOGEN 0.2 11/23/2008 1202   NITRITE NEGATIVE 06/22/2024 1803   LEUKOCYTESUR TRACE (A) 06/22/2024 1803    Radiological Exams on Admission: I have personally reviewed images MR Lumbar Spine W Wo Contrast Result Date: 06/23/2024 EXAM: MRI LUMBAR SPINE 06/23/2024 04:16:22 AM TECHNIQUE: Multiplanar multisequence MRI of the lumbar spine was performed without and with the administration of intravenous contrast. COMPARISON: None available. CLINICAL HISTORY: Low back pain, infection suspected, no prior imaging FINDINGS: BONES AND ALIGNMENT: Normal alignment. Normal vertebral body heights. Bone marrow signal is unremarkable. SPINAL CORD: The conus terminates normally. SOFT TISSUES: No paraspinal mass. L1-L2: No significant disc herniation. No spinal canal stenosis or neural foraminal narrowing. L2-L3: No significant disc herniation. No spinal canal stenosis or neural foraminal narrowing. L3-L4: No significant disc herniation. No spinal canal stenosis or neural foraminal narrowing. L4-L5: No significant disc herniation. No spinal canal stenosis or neural foraminal narrowing. L5-S1: No significant disc herniation. No spinal canal stenosis or neural foraminal narrowing. IMPRESSION: 1. No spinal canal stenosis or neural foraminal narrowing in the lumbar spine. Electronically signed by: Evalene Coho MD 06/23/2024 04:39 AM EST RP Workstation: [id number]   MR THORACIC SPINE W WO CONTRAST Result Date: 06/23/2024 EXAM: MRI THORACIC SPINE WITH AND WITHOUT INTRAVENOUS CONTRAST 06/23/2024 04:16:00 AM TECHNIQUE: Multiplanar multisequence MRI of the thoracic spine was performed with and without the administration of intravenous contrast. COMPARISON: None available. CLINICAL HISTORY: Mid-back pain, neuro deficit. FINDINGS: BONES AND ALIGNMENT: Normal alignment. Normal vertebral body heights. Bone marrow signal is unremarkable. No abnormal enhancement. SPINAL CORD: The spinal cord is normal in morphology,  volume, and signal intensity. There is no abnormal enhancement. SOFT TISSUES: Unremarkable. DEGENERATIVE CHANGES: There is mild disc space narrowing and minimal disc bulging at T4-T5 through T9-T10. There is no significant spinal canal or neural foraminal stenosis present at any of these levels. The spinal canal and neural foramina  are widely patent throughout the thoracic spine. IMPRESSION: 1. No spinal canal stenosis or neural foraminal narrowing in the thoracic spine. 2. Mild multilevel disc space narrowing and minimal disc bulging at T4-5 through T9-10 without significant stenosis. Electronically signed by: Evalene Coho MD 06/23/2024 04:34 AM EST RP Workstation: [id number]       Assessment/Plan: Principal Problem:   Lower extremity numbness Active Problems:   Bartholin's gland abscess   Sepsis (HCC)    Assessment and Plan: Bartholin gland abscess Sepsis secondary to Bartholin gland abscess -Patient presented to emergency department complaining of bilateral lower extremity numbness and weakness for 1 week ago.  Patient has these symptoms 1 month ago which has been resolved.  Also complaining about swelling of the vaginal area for 3 days and fever at home. -At presentation to ED patient found febrile, tachycardic and borderline hypertensive.  CBC showed leukocytosis.  Lactic acid elevated which has been resolved with 1 L of LR bolus.  ED physician perform physical exam which showed Bartholin gland abscess. - ED physician consulted on-call OB/GYN Dr. Jayne stated that we will see patient in the daytime for consult. - In the ED patient received 1 L of LR bolus.  Giving second liter of LR bolus and continue maintenance fluid LR at 125 cc/h - In ED patient received vancomycin  and ceftriaxone . - Broadening antibiotic with IV vancomycin  and cefepime .  Pharmacy recommended anaerobic coverage with IV metronidazole  in the setting of Bartholin gland abscess as well. - Need to follow-up with blood  cultures result. - Will follow-up with OB/GYN recommendation and formal consult.   Bilateral lower extremity numbness and weakness -Patient reported that bilateral lower extremity numbness 1 week and weakness for 1 month. Patient does not have any bowel or bladder dysfunction.  Patient denies any neck pain and neck rigidity. Physical exam bilateral lower extremity muscle strength 5/5 and there is no sensory deficient and tingling numbness. -MRI of the lumbar spine no stenosis or neural foramina. -MRI of the thoracic spine no stenosis or neural foramen.  Mild multilevel disc space narrowing and minimal disc bulging at T4-T5 through T9-T10 without significant stenosis. -Patient works as a SCIENTIST, CLINICAL (HISTOCOMPATIBILITY AND IMMUNOGENETICS) and she does a lot of weight lifting due to job purpose. -Please discuss this finding with neurosurgery in the daytime. - Ordering back brace and recommending to while during ambulation.   Maculopapular rash -RN reported that during cleaning process they have noticed few spots of macular rash on the abdomen, left arm and right thigh.  Patient denies any itchiness.  (Picture on the chart to review) -Unclear etiology of these maculopapular rash.    DVT prophylaxis:  Lovenox  Code Status:  Full Code Diet: Heart healthy diet Family Communication:   Family was present at bedside, at the time of interview. Opportunity was given to ask question and all questions were answered satisfactorily.  Disposition Plan: Need to follow-up with blood culture result, MRI of the lumbar and thoracic spine. Consults: None indicated at this time Admission status:   Inpatient, Step Down Unit  Severity of Illness: The appropriate patient status for this patient is INPATIENT. Inpatient status is judged to be reasonable and necessary in order to provide the required intensity of service to ensure the patient's safety. The patient's presenting symptoms, physical exam findings, and initial radiographic and laboratory data in the  context of their chronic comorbidities is felt to place them at high risk for further clinical deterioration. Furthermore, it is not anticipated that the patient will be medically stable  for discharge from the hospital within 2 midnights of admission.   * I certify that at the point of admission it is my clinical judgment that the patient will require inpatient hospital care spanning beyond 2 midnights from the point of admission due to high intensity of service, high risk for further deterioration and high frequency of surveillance required.DEWAINE    Briani Maul, MD [facility]  How to contact the [facility] Attending or Consulting provider 7A - 7P or covering provider during after hours 7P -7A, for this patient.  Check the care team in [facility] and look for a) attending/consulting [facility] provider listed and b) the [facility] team listed Log into [email or web address] and use [facility] universal password to access. If you do not have the password, please contact the hospital operator. Locate the [facility] provider you are looking for under [facility] and page to a number that you can be directly reached. If you still have difficulty reaching the provider, please page the [facility] (Director on Call) for the Hospitalists listed on amion for assistance.  06/23/2024, 4:58 AM

## 2024-06-24 MED ORDER — METRONIDAZOLE 500 MG PO TABS: 500.0000 mg | ORAL_TABLET | Freq: Two times a day (BID) | ORAL | 0 refills | Status: AC

## 2024-06-24 MED ORDER — VITAMIN D (ERGOCALCIFEROL) 1.25 MG (50000 UNIT) PO CAPS: 50000.0000 [IU] | ORAL_CAPSULE | ORAL | 0 refills | Status: AC

## 2024-06-24 MED ORDER — CEPHALEXIN 500 MG PO CAPS: 500.0000 mg | ORAL_CAPSULE | Freq: Three times a day (TID) | ORAL | 0 refills | Status: AC

## 2024-06-24 MED ORDER — VITAMIN B-12 1000 MCG PO TABS
1000.0000 ug | ORAL_TABLET | Freq: Every day | ORAL | Status: DC
  Administered 2024-06-24: 1000 ug via ORAL
  Filled 2024-06-24: qty 1

## 2024-06-24 MED ORDER — CEPHALEXIN 250 MG/5ML PO SUSR: 500.0000 mg | Freq: Three times a day (TID) | ORAL | 0 refills | Status: DC

## 2024-06-24 MED ORDER — CYANOCOBALAMIN 1000 MCG PO TABS: 1000.0000 ug | ORAL_TABLET | Freq: Every day | ORAL | 0 refills | Status: AC

## 2024-06-24 MED ORDER — VITAMIN D (ERGOCALCIFEROL) 1.25 MG (50000 UNIT) PO CAPS
50000.0000 [IU] | ORAL_CAPSULE | ORAL | Status: DC
  Administered 2024-06-24: 50000 [IU] via ORAL
  Filled 2024-06-24: qty 1

## 2024-06-24 MED ORDER — CEPHALEXIN 500 MG PO CAPS
500.0000 mg | ORAL_CAPSULE | Freq: Once | ORAL | Status: AC
  Administered 2024-06-24: 500 mg via ORAL
  Filled 2024-06-24: qty 1

## 2024-06-24 NOTE — Plan of Care (Signed)
  Problem: Pain Managment: Goal: General experience of comfort will improve and/or be controlled Outcome: Not Progressing   Problem: Clinical Measurements: Goal: Will remain free from infection Outcome: Not Progressing   Problem: Skin Integrity: Goal: Risk for impaired skin integrity will decrease Outcome: Not Progressing

## 2024-06-24 NOTE — Discharge Summary (Addendum)
 Physician Discharge Summary   Patient: Debbie Wolfe MRN: [id number] DOB: [birth year]  Admit date:     06/22/2024  Discharge date: 06/24/24  Discharge Physician: Owen DELENA Lore   PCP: Patient, No Pcp Per   Recommendations at discharge:    Needs to follow up with GYN for Bartholin gland abscess.  Needs to follow up with Neurology for intermittent numbness and tingling.   Discharge Diagnoses: Principal Problem:   Lower extremity numbness Active Problems:   Bartholin's gland abscess   Sepsis (HCC)   Hypokalemia  Resolved Problems:   * No resolved hospital problems. *  Hospital Course: 48 year old with no significant past medical history presents to the ED complaining of vaginal swelling and pain as well as bilateral lower extremity weakness that started 1 week ago.  Patient was noted to be mildly febrile.  No incontinence of urine or bowel.   Vaginal exam showed Bartholin gland abscess which actually was a spontaneously draining, ER physician drainage further and placed the catheter and discussed with on-call OB gynecology.  They will consult in the morning.   Due to concern for lower extremity numbness weakness MRI was ordered.  Patient also reports a rash in the abdomen, left arm and right leg since Monday.    Assessment and Plan:   1-Bartholin gland abscess Sepsis secondary to Bartholin gland abscess, POA -Patient presented with swelling of vaginal area for 3 days and fever at home. - She was found to be tachycardic, febrile, leukocytosis, elevated lactic acid. - OB/GYN  consulted. Dr Zina recommend oral antibiotics at discharge.  - Treated IV fluids. - Treated  IV antibiotics. Might need to be discharge with drain in placed. Usually can be remove in 4 weeks per GYN/.  -Follow Blood culture. No growth   drain came off, report no further swelling.  Plan to discharge with oral antibiotics keflex  and flagyl  for 6 days.    Bilateral lower extremity numbness and  weakness: -Patient reported bilateral lower extremity numbness for 1 week and weakness for 1 month.  Does not have any bowel or bladder dysfunction. - Physical exam bilateral muscle strength 5/5 - MRI lumbar spine no stenosis of the neuroforamina - MRI of the thoracic spine no stenosis.  Mild multilevel disc space narrowing and minimal disc bulge at T4-T5 through T9-T10 without significant stenosis. MRI discussed with Neurosurgeon, this finding couldn't explain numbness.  - B 12, Vitamin D . Low level, start supplement.  -Discussed with neurology, patient can follow up out patient for nerve conduction study.   -she doesn't need TLSO. declined  Maculopapular rash: -improved.                 Consultants: GYN Procedures performed: abscess drainage Disposition: Home Diet recommendation:  Discharge Diet Orders (From admission, onward)     Start     Ordered   06/24/24 0000  Diet - low sodium heart healthy        06/24/24 0828           Regular diet DISCHARGE MEDICATION: Allergies as of 06/24/2024       Reactions   Latex Dermatitis   Testing done 06/06/2016-results pending   Penicillins Rash        Medication List     TAKE these medications    acetaminophen  500 MG tablet Commonly known as: TYLENOL  Take 1,000 mg by mouth 2 (two) times daily as needed for headache (pain).   cephALEXin  500 MG capsule Commonly known as: KEFLEX  Take 1 capsule (  500 mg total) by mouth 3 (three) times daily for 6 days.   cyanocobalamin  1000 MCG tablet Take 1 tablet (1,000 mcg total) by mouth daily.   GOODYS EXTRA STRENGTH PO Take 1 packet by mouth 2 (two) times daily as needed (pain, headache).   metroNIDAZOLE  500 MG tablet Commonly known as: FLAGYL  Take 1 tablet (500 mg total) by mouth every 12 (twelve) hours for 6 days.   Vitamin D  (Ergocalciferol ) 1.25 MG (50000 UNIT) Caps capsule Commonly known as: DRISDOL  Take 1 capsule (50,000 Units total) by mouth every 7 (seven)  days.        Follow-up Information     [facility]. Schedule an appointment as soon as possible for a visit in 3 week(s).   Specialty: Obstetrics and Gynecology Why: bartholin's gland cyst follow up Contact information: [address] [place]  [address] [phone]               Discharge Exam: Debbie Wolfe   06/22/24 1800  Weight: 58.5 kg   General; NAD  Condition at discharge: stable  The results of significant diagnostics from this hospitalization (including imaging, microbiology, ancillary and laboratory) are listed below for reference.   Imaging Studies: MR Lumbar Spine W Wo Contrast Result Date: 06/23/2024 EXAM: MRI LUMBAR SPINE 06/23/2024 04:16:22 AM TECHNIQUE: Multiplanar multisequence MRI of the lumbar spine was performed without and with the administration of intravenous contrast. COMPARISON: None available. CLINICAL HISTORY: Low back pain, infection suspected, no prior imaging FINDINGS: BONES AND ALIGNMENT: Normal alignment. Normal vertebral body heights. Bone marrow signal is unremarkable. SPINAL CORD: The conus terminates normally. SOFT TISSUES: No paraspinal mass. L1-L2: No significant disc herniation. No spinal canal stenosis or neural foraminal narrowing. L2-L3: No significant disc herniation. No spinal canal stenosis or neural foraminal narrowing. L3-L4: No significant disc herniation. No spinal canal stenosis or neural foraminal narrowing. L4-L5: No significant disc herniation. No spinal canal stenosis or neural foraminal narrowing. L5-S1: No significant disc herniation. No spinal canal stenosis or neural foraminal narrowing. IMPRESSION: 1. No spinal canal stenosis or neural foraminal narrowing in the lumbar spine. Electronically signed by: Evalene Coho MD 06/23/2024 04:39 AM EST RP Workstation: [id number]   MR THORACIC SPINE W WO CONTRAST Result Date: 06/23/2024 EXAM: MRI  THORACIC SPINE WITH AND WITHOUT INTRAVENOUS CONTRAST 06/23/2024 04:16:00 AM TECHNIQUE: Multiplanar multisequence MRI of the thoracic spine was performed with and without the administration of intravenous contrast. COMPARISON: None available. CLINICAL HISTORY: Mid-back pain, neuro deficit. FINDINGS: BONES AND ALIGNMENT: Normal alignment. Normal vertebral body heights. Bone marrow signal is unremarkable. No abnormal enhancement. SPINAL CORD: The spinal cord is normal in morphology, volume, and signal intensity. There is no abnormal enhancement. SOFT TISSUES: Unremarkable. DEGENERATIVE CHANGES: There is mild disc space narrowing and minimal disc bulging at T4-T5 through T9-T10. There is no significant spinal canal or neural foraminal stenosis present at any of these levels. The spinal canal and neural foramina are widely patent throughout the thoracic spine. IMPRESSION: 1. No spinal canal stenosis or neural foraminal narrowing in the thoracic spine. 2. Mild multilevel disc space narrowing and minimal disc bulging at T4-5 through T9-10 without significant stenosis. Electronically signed by: Evalene Coho MD 06/23/2024 04:34 AM EST RP Workstation: [id number]    Microbiology: Results for orders placed or performed during the hospital encounter of 06/22/24  Blood culture (routine x 2)     Status: None (Preliminary result)   Collection Time: 06/22/24  8:12 PM  Specimen: BLOOD  Result Value Ref Range Status   Specimen Description   Final    BLOOD Blood Culture adequate volume Performed at [facility], [address] [place], [place] [address]    Special Requests   Final    BOTTLES DRAWN AEROBIC AND ANAEROBIC BLOOD LEFT FOREARM Performed at [facility], [address] [place], [place] [address]    Culture   Final    NO GROWTH < 12 HOURS Performed at [facility], [address] [place], [place] [address]    Report Status PENDING  Incomplete    Labs: CBC: Recent  Labs  Lab 06/22/24 1803 06/23/24 0608  WBC 14.1* 13.7*  NEUTROABS 10.6*  --   HGB 15.1* 12.0  HCT 45.3 35.8*  MCV 94.6 93.7  PLT 297 241   Basic Metabolic Panel: Recent Labs  Lab 06/22/24 1803 06/23/24 0608  NA 140 138  K 3.3* 3.5  CL 101 107  CO2 26 23  GLUCOSE 98 111*  BUN 16 8  CREATININE 0.83 0.74  CALCIUM 9.2 8.0*   Liver Function Tests: Recent Labs  Lab 06/22/24 1803 06/23/24 0608  AST 26 21  ALT 17 16  ALKPHOS 114 73  BILITOT 0.6 0.6  PROT 8.0 5.4*  ALBUMIN 4.7 2.9*   CBG: No results for input(s): GLUCAP in the last 168 hours.  Discharge time spent: greater than 30 minutes.  Signed: Owen DELENA Lore, MD [facility] 06/24/2024

## 2024-06-24 NOTE — Progress Notes (Signed)
 TLSO brace to be returned to ortho department. Call placed. Brace left at nurses station with patient sticker. Ortho tech to pick up.

## 2024-06-27 LAB — CULTURE, BLOOD (ROUTINE X 2)
Culture: NO GROWTH
Specimen Description: ADEQUATE

## 2024-06-30 ENCOUNTER — Telehealth: Payer: Self-pay | Admitting: *Deleted

## 2024-06-30 NOTE — Telephone Encounter (Signed)
-----   Message from Isaiah ORN sent at 06/28/2024  8:35 AM EST ----- Regarding: FW: hospital follow up Good morning,   [facility] doesn't have anything at all in 3 weeks because of lack of providers. Are you able to get the patient scheduled? ----- Message ----- From: Zina Jerilynn LABOR, MD Sent: 06/23/2024   5:37 PM EST To: [facility] Admin Subject: hospital follow up                             Pt needs follow up for right bartholin's gland cyst with catheter in 3 weeks  Thank you  Zina, MD

## 2024-06-30 NOTE — Telephone Encounter (Signed)
Left patient a message to call and schedule. 

## 2024-07-05 ENCOUNTER — Ambulatory Visit: Admitting: Family

## 2024-07-23 NOTE — Progress Notes (Signed)
 [facility] Population Health post TCM follow up  Date of call: 07/23/24  Discharged from: [facility]  Updates/Changes since last encounter: Left message to contact [facility] or PCP office with any  needs or concerns.   Current Questions/Concerns: left message to contact PCP office or [facility] with any concerns.  Is patient candidate for Navigation: no  Electronically signed by: Suzen GORMAN Sharps 07/23/2024 9:48 AM

## 2024-08-03 ENCOUNTER — Ambulatory Visit: Admitting: Obstetrics and Gynecology

## 2024-08-03 ENCOUNTER — Other Ambulatory Visit (HOSPITAL_COMMUNITY)
Admission: RE | Admit: 2024-08-03 | Discharge: 2024-08-03 | Disposition: A | Discharge status: Home / Self Care | Source: Ambulatory Visit | Attending: Obstetrics and Gynecology | Admitting: Obstetrics and Gynecology

## 2024-08-03 ENCOUNTER — Encounter: Payer: Self-pay | Admitting: Obstetrics and Gynecology

## 2024-08-03 VITALS — BP 184/110 | HR 95 | Ht 59.0 in | Wt 137.5 lb

## 2024-08-03 DIAGNOSIS — N751 Abscess of Bartholin's gland: Secondary | ICD-10-CM

## 2024-08-03 DIAGNOSIS — Z01419 Encounter for gynecological examination (general) (routine) without abnormal findings: Secondary | ICD-10-CM | POA: Insufficient documentation

## 2024-08-03 NOTE — Progress Notes (Signed)
" °  CC: bartholin's follow up Subjective:    Patient ID: Debbie Wolfe, female    DOB: 1976/06/22, 49 y.o.   MRN: [id number]  HPI 49 yo G4P2 seen for follow up of consult of Bartholin's gland cyst.  Cyst already had I and D in the hospital with placement of ward catheter.  Catheter fell out before pt was discharged from the hospital.  Pt denies pain currently or recurrence of cyst.  Pt notes some intermittent vaginal bleeding and requests pap smear today.   Review of Systems     Objective:   Physical Exam Vitals:   08/03/24 1502 08/03/24 1508  BP: (!) 187/118 (!) 184/110  Pulse: 96 95   Right bartholins cyst is not present, mild puffiness in that area, but no obvious cyst.   SSE: vagina WNL, cervix WNL, pap taken without incident with chaperone present.      Assessment & Plan:   1. Bartholin's gland abscess (Primary) Would try ward catheter again if there is a recurrence. PT is currently asymptomatic and is doing well  2. Pap smear, as part of routine gynecological examination Pt request - Cytology - PAP    Jerilynn DELENA Buddle, MD Faculty Attending, [facility]  "

## 2024-08-03 NOTE — Progress Notes (Signed)
 Pt presents for ER f/u. Pt has been bleeding off and on since ER visit. No other questions or concerns at this time.

## 2024-08-06 ENCOUNTER — Ambulatory Visit: Payer: Self-pay | Admitting: Obstetrics and Gynecology

## 2024-08-06 LAB — CYTOLOGY - PAP
Chlamydia: NEGATIVE
Comment: NEGATIVE
Comment: NEGATIVE
Comment: NORMAL
Diagnosis: NEGATIVE
High risk HPV: NEGATIVE
Neisseria Gonorrhea: NEGATIVE
